# Patient Record
Sex: Female | Born: 1995
Health system: Southern US, Community
[De-identification: ages and names within clinical notes are randomized; demographics above are authoritative.]

## PROBLEM LIST (undated history)

## (undated) DIAGNOSIS — J45909 Unspecified asthma, uncomplicated: Secondary | ICD-10-CM

## (undated) HISTORY — PX: NO PAST SURGERIES: SHX2092

---
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
## 2014-09-03 ENCOUNTER — Emergency Department (HOSPITAL_COMMUNITY)
Admission: EM | Admit: 2014-09-03 | Discharge: 2014-09-04 | Disposition: A | Discharge status: Home / Self Care | Payer: 59 | Attending: Emergency Medicine | Admitting: Emergency Medicine

## 2014-09-03 ENCOUNTER — Encounter (HOSPITAL_COMMUNITY): Payer: Self-pay | Admitting: *Deleted

## 2014-09-03 DIAGNOSIS — R791 Abnormal coagulation profile: Secondary | ICD-10-CM | POA: Diagnosis not present

## 2014-09-03 DIAGNOSIS — J45901 Unspecified asthma with (acute) exacerbation: Secondary | ICD-10-CM | POA: Diagnosis not present

## 2014-09-03 DIAGNOSIS — R079 Chest pain, unspecified: Secondary | ICD-10-CM | POA: Diagnosis present

## 2014-09-03 DIAGNOSIS — Z3202 Encounter for pregnancy test, result negative: Secondary | ICD-10-CM | POA: Insufficient documentation

## 2014-09-03 DIAGNOSIS — R0602 Shortness of breath: Secondary | ICD-10-CM

## 2014-09-03 DIAGNOSIS — R7989 Other specified abnormal findings of blood chemistry: Secondary | ICD-10-CM

## 2014-09-03 HISTORY — DX: Unspecified asthma, uncomplicated: J45.909

## 2014-09-03 MED ORDER — ALBUTEROL SULFATE (2.5 MG/3ML) 0.083% IN NEBU
5.0000 mg | INHALATION_SOLUTION | Freq: Once | RESPIRATORY_TRACT | Status: AC
  Administered 2014-09-03: 5 mg via RESPIRATORY_TRACT
  Filled 2014-09-03: qty 6
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
## 2014-09-03 NOTE — ED Notes (Signed)
The pt is c/o mid-chest pain fior 2 weeks.  Hx of asthma and she is c/o sob.  No visible son at present.  lmp last month

## 2014-09-04 ENCOUNTER — Emergency Department (HOSPITAL_COMMUNITY): Payer: 59

## 2014-09-04 ENCOUNTER — Encounter (HOSPITAL_COMMUNITY): Payer: Self-pay | Admitting: Radiology

## 2014-09-04 LAB — CBC WITH DIFFERENTIAL/PLATELET
Basophils Absolute: 0.1 10*3/uL (ref 0.0–0.1)
Basophils Relative: 1 % (ref 0–1)
Eosinophils Absolute: 0.1 10*3/uL (ref 0.0–0.7)
Eosinophils Relative: 1 % (ref 0–5)
HCT: 37.4 % (ref 36.0–46.0)
Hemoglobin: 12.3 g/dL (ref 12.0–15.0)
Lymphocytes Relative: 40 % (ref 12–46)
Lymphs Abs: 2.1 10*3/uL (ref 0.7–4.0)
MCH: 27.1 pg (ref 26.0–34.0)
MCHC: 32.9 g/dL (ref 30.0–36.0)
MCV: 82.4 fL (ref 78.0–100.0)
Monocytes Absolute: 0.8 10*3/uL (ref 0.1–1.0)
Monocytes Relative: 14 % — ABNORMAL HIGH (ref 3–12)
Neutro Abs: 2.3 10*3/uL (ref 1.7–7.7)
Neutrophils Relative %: 44 % (ref 43–77)
Platelets: 334 10*3/uL (ref 150–400)
RBC: 4.54 MIL/uL (ref 3.87–5.11)
RDW: 13.7 % (ref 11.5–15.5)
WBC: 5.3 10*3/uL (ref 4.0–10.5)

## 2014-09-04 LAB — I-STAT CHEM 8, ED
BUN: 11 mg/dL (ref 6–23)
Calcium, Ion: 1.26 mmol/L — ABNORMAL HIGH (ref 1.12–1.23)
Chloride: 105 mEq/L (ref 96–112)
Creatinine, Ser: 0.8 mg/dL (ref 0.50–1.10)
Glucose, Bld: 93 mg/dL (ref 70–99)
HCT: 42 % (ref 36.0–46.0)
Hemoglobin: 14.3 g/dL (ref 12.0–15.0)
Potassium: 3.5 mEq/L — ABNORMAL LOW (ref 3.7–5.3)
Sodium: 140 mEq/L (ref 137–147)
TCO2: 22 mmol/L (ref 0–100)

## 2014-09-04 LAB — POC URINE PREG, ED: Preg Test, Ur: NEGATIVE

## 2014-09-04 LAB — D-DIMER, QUANTITATIVE: D-Dimer, Quant: 2.69 ug/mL-FEU — ABNORMAL HIGH (ref 0.00–0.48)

## 2014-09-04 MED ORDER — IOHEXOL 350 MG/ML SOLN
100.0000 mL | Freq: Once | INTRAVENOUS | Status: AC | PRN
  Administered 2014-09-04: 100 mL via INTRAVENOUS

## 2014-09-04 NOTE — Discharge Instructions (Signed)

## 2014-09-04 NOTE — ED Notes (Signed)
Patient transported to X-ray 

## 2016-03-06 ENCOUNTER — Encounter (HOSPITAL_COMMUNITY): Payer: Self-pay | Admitting: Emergency Medicine

## 2016-03-06 ENCOUNTER — Emergency Department (HOSPITAL_COMMUNITY)
Admission: EM | Admit: 2016-03-06 | Discharge: 2016-03-06 | Disposition: A | Discharge status: Home / Self Care | Payer: Commercial Managed Care - HMO | Attending: Emergency Medicine | Admitting: Emergency Medicine

## 2016-03-06 ENCOUNTER — Emergency Department (HOSPITAL_COMMUNITY): Payer: Commercial Managed Care - HMO

## 2016-03-06 DIAGNOSIS — J40 Bronchitis, not specified as acute or chronic: Secondary | ICD-10-CM | POA: Insufficient documentation

## 2016-03-06 DIAGNOSIS — Z79899 Other long term (current) drug therapy: Secondary | ICD-10-CM | POA: Insufficient documentation

## 2016-03-06 DIAGNOSIS — R0981 Nasal congestion: Secondary | ICD-10-CM

## 2016-03-06 DIAGNOSIS — Z792 Long term (current) use of antibiotics: Secondary | ICD-10-CM | POA: Insufficient documentation

## 2016-03-06 DIAGNOSIS — R509 Fever, unspecified: Secondary | ICD-10-CM | POA: Diagnosis present

## 2016-03-06 DIAGNOSIS — R599 Enlarged lymph nodes, unspecified: Secondary | ICD-10-CM | POA: Diagnosis not present

## 2016-03-06 DIAGNOSIS — Z7952 Long term (current) use of systemic steroids: Secondary | ICD-10-CM | POA: Insufficient documentation

## 2016-03-06 DIAGNOSIS — J209 Acute bronchitis, unspecified: Secondary | ICD-10-CM

## 2016-03-06 LAB — RAPID STREP SCREEN (MED CTR MEBANE ONLY): Streptococcus, Group A Screen (Direct): NEGATIVE

## 2016-03-06 MED ORDER — PREDNISONE 20 MG PO TABS: ORAL_TABLET | ORAL | Status: DC

## 2016-03-06 MED ORDER — LORATADINE 10 MG PO TABS: 10.0000 mg | ORAL_TABLET | Freq: Every day | ORAL | Status: DC

## 2016-03-06 MED ORDER — ALBUTEROL SULFATE HFA 108 (90 BASE) MCG/ACT IN AERS
2.0000 | INHALATION_SPRAY | Freq: Once | RESPIRATORY_TRACT | Status: AC
  Administered 2016-03-06: 2 via RESPIRATORY_TRACT
  Filled 2016-03-06: qty 6.7

## 2016-03-06 MED ORDER — OXYMETAZOLINE HCL 0.05 % NA SOLN: 1.0000 | Freq: Two times a day (BID) | NASAL | Status: DC

## 2016-03-06 MED ORDER — MOMETASONE FUROATE 50 MCG/ACT NA SUSP: 2.0000 | Freq: Every day | NASAL | Status: DC

## 2016-03-06 MED ORDER — AZITHROMYCIN 250 MG PO TABS: ORAL_TABLET | ORAL | Status: DC

## 2016-03-06 MED ORDER — ALBUTEROL SULFATE (2.5 MG/3ML) 0.083% IN NEBU
5.0000 mg | INHALATION_SOLUTION | Freq: Once | RESPIRATORY_TRACT | Status: AC
  Administered 2016-03-06: 5 mg via RESPIRATORY_TRACT
  Filled 2016-03-06: qty 6

## 2016-03-06 NOTE — ED Provider Notes (Signed)
CSN: [id number]     Arrival date & time 03/06/16  1459 History   First MD Initiated Contact with Patient 03/06/16 1550     Chief Complaint  Patient presents with  . Fever     (Consider location/radiation/quality/duration/timing/severity/associated sxs/prior Treatment) HPI Patient with 5-6 days of fever, intermittent chills, productive cough, and shortness of breath. Was seen by urgent care on Friday diagnosed with sinus infection. Started on Omnicef. Patient states there's been no improvement of her symptoms. She denies any nausea, vomiting or diarrhea. She's had no urinary complaints. No sinus pressure or sore throat. Denies neck pain or stiffness. Past Medical History  Diagnosis Date  . Asthma    History reviewed. No pertinent past surgical history. History reviewed. No pertinent family history. Social History  Substance Use Topics  . Smoking status: Never Smoker   . Smokeless tobacco: None  . Alcohol Use: Yes   OB History    No data available     Review of Systems  Constitutional: Positive for fever, chills and fatigue.  HENT: Negative for congestion, sinus pressure and sore throat.   Respiratory: Positive for cough and shortness of breath. Negative for choking.   Cardiovascular: Negative for chest pain, palpitations and leg swelling.  Gastrointestinal: Negative for nausea, vomiting, abdominal pain and diarrhea.  Genitourinary: Negative for dysuria, frequency and flank pain.  Musculoskeletal: Negative for back pain, neck pain and neck stiffness.  Skin: Negative for rash and wound.  Neurological: Negative for dizziness, weakness, light-headedness, numbness and headaches.  All other systems reviewed and are negative.     Allergies  Review of patient's allergies indicates no known allergies.  Home Medications   Prior to Admission medications   Medication Sig Start Date End Date Taking? Authorizing Provider  etonogestrel (NEXPLANON) 68 MG IMPL implant 1 each by  Subdermal route continuous.   Yes Historical Provider, MD  azithromycin (ZITHROMAX Z-PAK) 250 MG tablet 2 po day one, then 1 daily x 4 days 03/06/16   Loren Racer, MD  loratadine (CLARITIN) 10 MG tablet Take 1 tablet (10 mg total) by mouth daily. 03/06/16   Loren Racer, MD  mometasone (NASONEX) 50 MCG/ACT nasal spray Place 2 sprays into the nose daily. 03/06/16   Loren Racer, MD  oxymetazoline (AFRIN NASAL SPRAY) 0.05 % nasal spray Place 1 spray into both nostrils 2 (two) times daily. 03/06/16   Loren Racer, MD  predniSONE (DELTASONE) 20 MG tablet 3 tabs po day one, then 2 po daily x 4 days 03/06/16   Loren Racer, MD   BP 133/83 mmHg  Pulse 125  Temp(Src) 99 F (37.2 C) (Oral)  Resp 18  SpO2 98%  LMP 02/05/2016 Physical Exam  Constitutional: She is oriented to person, place, and time. She appears well-developed and well-nourished. No distress.  HENT:  Head: Normocephalic and atraumatic.  Mouth/Throat: Oropharynx is clear and moist.  Difficult to visualize oropharynx, minimal nasal mucosal edema. No sinus tenderness to percussion.  Eyes: EOM are normal. Pupils are equal, round, and reactive to light.  Neck: Normal range of motion. Neck supple.  Cardiovascular: Normal rate and regular rhythm.  Exam reveals no gallop and no friction rub.   No murmur heard. Pulmonary/Chest: Effort normal. No respiratory distress. She has no wheezes. She has rales (few scattered rales with diminished breath sounds bilateral bases.). She exhibits no tenderness.  Abdominal: Soft. Bowel sounds are normal. She exhibits no distension and no mass. There is no tenderness. There is no rebound and no guarding.  Musculoskeletal: Normal range of motion. She exhibits no edema or tenderness.  No CVA tenderness bilaterally. No lower extremity swelling or asymmetry. Distal pulses equal and intact.  Lymphadenopathy:    She has cervical adenopathy.  Neurological: She is alert and oriented to person, place, and  time.  Skin: Skin is warm and dry. No rash noted. No erythema.  Psychiatric: She has a normal mood and affect. Her behavior is normal.  Nursing note and vitals reviewed.   ED Course  Procedures (including critical care time) Labs Review Labs Reviewed  RAPID STREP SCREEN (NOT AT [facility])  CULTURE, GROUP A STREP [facility])    Imaging Review Dg Chest 2 View  03/06/2016  CLINICAL DATA:  Cough for 3 weeks.  Asthma. EXAM: CHEST  2 VIEW COMPARISON:  Multiple exams, including 09/04/2014 FINDINGS: The heart size and mediastinal contours are within normal limits. Both lungs are clear. The visualized skeletal structures are unremarkable. IMPRESSION: No active cardiopulmonary disease. Electronically Signed   By: Gaylyn RongWalter  Liebkemann M.D.   On: 03/06/2016 16:32   I have personally reviewed and evaluated these images and lab results as part of my medical decision-making.   EKG Interpretation None      MDM   Final diagnoses:  Bronchitis with bronchospasm  Nasal congestion    Breathing is improved after the treatment. Given albuterol inhaler. We'll get short course of prednisone and Z-Pak.    Loren Raceravid Wayde Gopaul, MD 03/07/16 [phone]

## 2016-03-06 NOTE — Discharge Instructions (Signed)
Upper Respiratory Infection, Adult  Most upper respiratory infections (URIs) are a viral infection of the air passages leading to the lungs. A URI affects the nose, throat, and upper air passages. The most common type of URI is nasopharyngitis and is typically referred to as "the common cold."  URIs run their course and usually go away on their own. Most of the time, a URI does not require medical attention, but sometimes a bacterial infection in the upper airways can follow a viral infection. This is called a secondary infection. Sinus and middle ear infections are common types of secondary upper respiratory infections.  Bacterial pneumonia can also complicate a URI. A URI can worsen asthma and chronic obstructive pulmonary disease (COPD). Sometimes, these complications can require emergency medical care and may be life threatening.   CAUSES  Almost all URIs are caused by viruses. A virus is a type of germ and can spread from one person to another.   RISKS FACTORS  You may be at risk for a URI if:    You smoke.    You have chronic heart or lung disease.   You have a weakened defense (immune) system.    You are very young or very old.    You have nasal allergies or asthma.   You work in crowded or poorly ventilated areas.   You work in health care facilities or schools.  SIGNS AND SYMPTOMS   Symptoms typically develop 2-3 days after you come in contact with a cold virus. Most viral URIs last 7-10 days. However, viral URIs from the influenza virus (flu virus) can last 14-18 days and are typically more severe. Symptoms may include:    Runny or stuffy (congested) nose.    Sneezing.    Cough.    Sore throat.    Headache.    Fatigue.    Fever.    Loss of appetite.    Pain in your forehead, behind your eyes, and over your cheekbones (sinus pain).   Muscle aches.   DIAGNOSIS   Your health care provider may diagnose a URI by:   Physical exam.   Tests to check that your symptoms are not due to  another condition such as:   Strep throat.   Sinusitis.   Pneumonia.   Asthma.  TREATMENT   A URI goes away on its own with time. It cannot be cured with medicines, but medicines may be prescribed or recommended to relieve symptoms. Medicines may help:   Reduce your fever.   Reduce your cough.   Relieve nasal congestion.  HOME CARE INSTRUCTIONS    Take medicines only as directed by your health care provider.    Gargle warm saltwater or take cough drops to comfort your throat as directed by your health care provider.   Use a warm mist humidifier or inhale steam from a shower to increase air moisture. This may make it easier to breathe.   Drink enough fluid to keep your urine clear or pale yellow.    Eat soups and other clear broths and maintain good nutrition.    Rest as needed.    Return to work when your temperature has returned to normal or as your health care provider advises. You may need to stay home longer to avoid infecting others. You can also use a face mask and careful hand washing to prevent spread of the virus.   Increase the usage of your inhaler if you have asthma.    Do not   use any tobacco products, including cigarettes, chewing tobacco, or electronic cigarettes. If you need help quitting, ask your health care provider.  PREVENTION   The best way to protect yourself from getting a cold is to practice good hygiene.    Avoid oral or hand contact with people with cold symptoms.    Wash your hands often if contact occurs.   There is no clear evidence that vitamin C, vitamin E, echinacea, or exercise reduces the chance of developing a cold. However, it is always recommended to get plenty of rest, exercise, and practice good nutrition.   SEEK MEDICAL CARE IF:    You are getting worse rather than better.    Your symptoms are not controlled by medicine.    You have chills.   You have worsening shortness of breath.   You have brown or red mucus.   You have yellow or brown nasal  discharge.   You have pain in your face, especially when you bend forward.   You have a fever.   You have swollen neck glands.   You have pain while swallowing.   You have white areas in the back of your throat.  SEEK IMMEDIATE MEDICAL CARE IF:    You have severe or persistent:    Headache.    Ear pain.    Sinus pain.    Chest pain.   You have chronic lung disease and any of the following:    Wheezing.    Prolonged cough.    Coughing up blood.    A change in your usual mucus.   You have a stiff neck.   You have changes in your:    Vision.    Hearing.    Thinking.    Mood.  MAKE SURE YOU:    Understand these instructions.   Will watch your condition.   Will get help right away if you are not doing well or get worse.     This information is not intended to replace advice given to you by your health care provider. Make sure you discuss any questions you have with your health care provider.     Document Released: 03/21/2001 Document Revised: 02/09/2015 Document Reviewed: 12/31/2013  Elsevier Interactive Patient Education 2016 [employer]    Bronchospasm, Adult  A bronchospasm is a spasm or tightening of the airways going into the lungs. During a bronchospasm breathing becomes more difficult because the airways get smaller. When this happens there can be coughing, a whistling sound when breathing (wheezing), and difficulty breathing. Bronchospasm is often associated with asthma, but not all patients who experience a bronchospasm have asthma.  CAUSES   A bronchospasm is caused by inflammation or irritation of the airways. The inflammation or irritation may be triggered by:    Allergies (such as to animals, pollen, food, or mold). Allergens that cause bronchospasm may cause wheezing immediately after exposure or many hours later.    Infection. Viral infections are believed to be the most common cause of bronchospasm.    Exercise.    Irritants (such as pollution, cigarette smoke, strong odors, aerosol  sprays, and paint fumes).    Weather changes. Winds increase molds and pollens in the air. Rain refreshes the air by washing irritants out. Cold air may cause inflammation.    Stress and emotional upset.   SIGNS AND SYMPTOMS    Wheezing.    Excessive nighttime coughing.    Frequent or severe coughing with a simple cold.      Chest tightness.    Shortness of breath.   DIAGNOSIS   Bronchospasm is usually diagnosed through a history and physical exam. Tests, such as chest X-rays, are sometimes done to look for other conditions.  TREATMENT    Inhaled medicines can be given to open up your airways and help you breathe. The medicines can be given using either an inhaler or a nebulizer machine.   Corticosteroid medicines may be given for severe bronchospasm, usually when it is associated with asthma.  HOME CARE INSTRUCTIONS    Always have a plan prepared for seeking medical care. Know when to call your health care provider and local emergency services (911 in the [place] Know where you can access local emergency care.   Only take medicines as directed by your health care provider.   If you were prescribed an inhaler or nebulizer machine, ask your health care provider to explain how to use it correctly. Always use a spacer with your inhaler if you were given one.   It is necessary to remain calm during an attack. Try to relax and breathe more slowly.   Control your home environment in the following ways:     Change your heating and air conditioning filter at least once a month.     Limit your use of fireplaces and wood stoves.    Do not smoke and do not allow smoking in your home.     Avoid exposure to perfumes and fragrances.     Get rid of pests (such as roaches and mice) and their droppings.     Throw away plants if you see mold on them.     Keep your house clean and dust free.     Replace carpet with wood, tile, or vinyl flooring. Carpet can trap dander and dust.     Use allergy-proof  pillows, mattress covers, and box spring covers.     Wash bed sheets and blankets every week in hot water and dry them in a dryer.     Use blankets that are made of polyester or cotton.     Wash hands frequently.  SEEK MEDICAL CARE IF:    You have muscle aches.    You have chest pain.    The sputum changes from clear or white to yellow, green, gray, or bloody.    The sputum you cough up gets thicker.    There are problems that may be related to the medicine you are given, such as a rash, itching, swelling, or trouble breathing.   SEEK IMMEDIATE MEDICAL CARE IF:    You have worsening wheezing and coughing even after taking your prescribed medicines.    You have increased difficulty breathing.    You develop severe chest pain.  MAKE SURE YOU:    Understand these instructions.   Will watch your condition.   Will get help right away if you are not doing well or get worse.     This information is not intended to replace advice given to you by your health care provider. Make sure you discuss any questions you have with your health care provider.     Document Released: 09/28/2003 Document Revised: 10/16/2014 Document Reviewed: 03/17/2013  Elsevier Interactive Patient Education 2016 [employer]

## 2016-03-06 NOTE — ED Notes (Signed)
Monique Turner at bedside. 

## 2016-03-06 NOTE — ED Notes (Signed)
Per Genevie CheshireKristen HOLD on attempting another IV or collecting blood; DG chest prior.

## 2016-03-06 NOTE — ED Notes (Signed)
2 unsuccessful attempt to start IV/blood draw. Covil at bedside attempting blood draw at present time.

## 2016-03-06 NOTE — ED Notes (Signed)
Pt c/o fever and chills x 3 weeks. Seen at Urgent care 1 week ago, diagnosed with sinus infection but fever has not resolved. First episode diarrhea today.

## 2016-03-09 LAB — CULTURE, GROUP A STREP (THRC)

## 2018-03-15 ENCOUNTER — Emergency Department (HOSPITAL_COMMUNITY): Payer: 59

## 2018-03-15 ENCOUNTER — Emergency Department (HOSPITAL_COMMUNITY)
Admission: EM | Admit: 2018-03-15 | Discharge: 2018-03-15 | Disposition: A | Discharge status: Home / Self Care | Payer: 59 | Attending: Emergency Medicine | Admitting: Emergency Medicine

## 2018-03-15 DIAGNOSIS — M545 Low back pain: Secondary | ICD-10-CM | POA: Diagnosis not present

## 2018-03-15 DIAGNOSIS — R110 Nausea: Secondary | ICD-10-CM | POA: Diagnosis not present

## 2018-03-15 DIAGNOSIS — R51 Headache: Secondary | ICD-10-CM | POA: Insufficient documentation

## 2018-03-15 DIAGNOSIS — J45909 Unspecified asthma, uncomplicated: Secondary | ICD-10-CM | POA: Insufficient documentation

## 2018-03-15 LAB — POC URINE PREG, ED: Preg Test, Ur: NEGATIVE

## 2018-03-15 MED ORDER — ACETAMINOPHEN 500 MG PO TABS
1000.0000 mg | ORAL_TABLET | Freq: Once | ORAL | Status: AC
  Administered 2018-03-15: 1000 mg via ORAL
  Filled 2018-03-15: qty 2

## 2018-03-15 MED ORDER — ONDANSETRON 4 MG PO TBDP
4.0000 mg | ORAL_TABLET | Freq: Once | ORAL | Status: AC
  Administered 2018-03-15: 4 mg via ORAL
  Filled 2018-03-15: qty 1

## 2018-03-15 MED ORDER — ACETAMINOPHEN 500 MG PO TABS: 500.0000 mg | ORAL_TABLET | Freq: Four times a day (QID) | ORAL | 0 refills | Status: DC | PRN

## 2018-03-15 MED ORDER — METHOCARBAMOL 500 MG PO TABS: 500.0000 mg | ORAL_TABLET | Freq: Two times a day (BID) | ORAL | 0 refills | Status: DC

## 2018-03-15 MED ORDER — IBUPROFEN 600 MG PO TABS: 600.0000 mg | ORAL_TABLET | Freq: Four times a day (QID) | ORAL | 0 refills | Status: DC | PRN

## 2018-03-15 MED ORDER — ONDANSETRON HCL 4 MG PO TABS: 4.0000 mg | ORAL_TABLET | Freq: Four times a day (QID) | ORAL | 0 refills | Status: DC
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
## 2018-03-15 NOTE — ED Notes (Signed)
Bed: WTR6 Expected date:  Expected time:  Means of arrival:  Comments: 
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
## 2018-08-05 ENCOUNTER — Telehealth: Payer: Self-pay | Admitting: *Deleted

## 2018-08-27 NOTE — Telephone Encounter (Signed)
A user error has taken place: encounter opened in error, closed for administrative reasons.
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
## 2019-10-27 ENCOUNTER — Encounter (HOSPITAL_COMMUNITY): Payer: Self-pay | Admitting: Emergency Medicine

## 2019-10-27 ENCOUNTER — Other Ambulatory Visit: Payer: Self-pay

## 2019-10-27 DIAGNOSIS — R519 Headache, unspecified: Secondary | ICD-10-CM | POA: Insufficient documentation

## 2019-10-27 DIAGNOSIS — Z5321 Procedure and treatment not carried out due to patient leaving prior to being seen by health care provider: Secondary | ICD-10-CM | POA: Diagnosis not present
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
## 2019-10-28 ENCOUNTER — Emergency Department (HOSPITAL_COMMUNITY)
Admission: EM | Admit: 2019-10-28 | Discharge: 2019-10-28 | Disposition: A | Discharge status: Home / Self Care | Payer: 59 | Attending: Emergency Medicine | Admitting: Emergency Medicine

## 2019-10-28 NOTE — ED Notes (Signed)
Pt was not seen in lobby when called for vitals.

## 2019-10-28 NOTE — ED Notes (Signed)
Pt not seen in lobby when called for vitals to be updated.

## 2020-03-11 ENCOUNTER — Emergency Department (HOSPITAL_BASED_OUTPATIENT_CLINIC_OR_DEPARTMENT_OTHER): Payer: 59

## 2020-03-11 ENCOUNTER — Encounter (HOSPITAL_BASED_OUTPATIENT_CLINIC_OR_DEPARTMENT_OTHER): Payer: Self-pay | Admitting: *Deleted

## 2020-03-11 ENCOUNTER — Emergency Department (HOSPITAL_BASED_OUTPATIENT_CLINIC_OR_DEPARTMENT_OTHER)
Admission: EM | Admit: 2020-03-11 | Discharge: 2020-03-12 | Disposition: A | Discharge status: Home / Self Care | Payer: 59 | Attending: Emergency Medicine | Admitting: Emergency Medicine

## 2020-03-11 ENCOUNTER — Other Ambulatory Visit: Payer: Self-pay

## 2020-03-11 DIAGNOSIS — J45909 Unspecified asthma, uncomplicated: Secondary | ICD-10-CM | POA: Insufficient documentation

## 2020-03-11 DIAGNOSIS — R112 Nausea with vomiting, unspecified: Secondary | ICD-10-CM | POA: Insufficient documentation

## 2020-03-11 DIAGNOSIS — R110 Nausea: Secondary | ICD-10-CM

## 2020-03-11 DIAGNOSIS — R05 Cough: Secondary | ICD-10-CM | POA: Insufficient documentation

## 2020-03-11 DIAGNOSIS — M791 Myalgia, unspecified site: Secondary | ICD-10-CM | POA: Insufficient documentation

## 2020-03-11 DIAGNOSIS — E876 Hypokalemia: Secondary | ICD-10-CM | POA: Diagnosis not present

## 2020-03-11 DIAGNOSIS — Z20822 Contact with and (suspected) exposure to covid-19: Secondary | ICD-10-CM | POA: Diagnosis not present

## 2020-03-11 LAB — COMPREHENSIVE METABOLIC PANEL
ALT: 15 U/L (ref 0–44)
AST: 23 U/L (ref 15–41)
Albumin: 4.1 g/dL (ref 3.5–5.0)
Alkaline Phosphatase: 50 U/L (ref 38–126)
Anion gap: 13 (ref 5–15)
BUN: 6 mg/dL (ref 6–20)
CO2: 23 mmol/L (ref 22–32)
Calcium: 9.1 mg/dL (ref 8.9–10.3)
Chloride: 99 mmol/L (ref 98–111)
Creatinine, Ser: 0.71 mg/dL (ref 0.44–1.00)
GFR calc Af Amer: >60 mL/min (ref >60)
GFR calc non Af Amer: >60 mL/min (ref >60)
Glucose, Bld: 91 mg/dL (ref 70–99)
Potassium: 2.8 mmol/L — ABNORMAL LOW (ref 3.5–5.1)
Sodium: 135 mmol/L (ref 135–145)
Total Bilirubin: 0.6 mg/dL (ref 0.3–1.2)
Total Protein: 8 g/dL (ref 6.5–8.1)

## 2020-03-11 LAB — CBC WITH DIFFERENTIAL/PLATELET
Abs Immature Granulocytes: 0.02 10*3/uL (ref 0.00–0.07)
Basophils Absolute: 0 10*3/uL (ref 0.0–0.1)
Basophils Relative: 0 %
Eosinophils Absolute: 0 10*3/uL (ref 0.0–0.5)
Eosinophils Relative: 0 %
HCT: 33.8 % — ABNORMAL LOW (ref 36.0–46.0)
Hemoglobin: 11.1 g/dL — ABNORMAL LOW (ref 12.0–15.0)
Immature Granulocytes: 0 %
Lymphocytes Relative: 22 %
Lymphs Abs: 1.8 10*3/uL (ref 0.7–4.0)
MCH: 26.4 pg (ref 26.0–34.0)
MCHC: 32.8 g/dL (ref 30.0–36.0)
MCV: 80.3 fL (ref 80.0–100.0)
Monocytes Absolute: 0.8 10*3/uL (ref 0.1–1.0)
Monocytes Relative: 10 %
Neutro Abs: 5.7 10*3/uL (ref 1.7–7.7)
Neutrophils Relative %: 68 %
Platelets: 394 10*3/uL (ref 150–400)
RBC: 4.21 MIL/uL (ref 3.87–5.11)
RDW: 13.6 % (ref 11.5–15.5)
WBC: 8.4 10*3/uL (ref 4.0–10.5)
nRBC: 0 % (ref 0.0–0.2)

## 2020-03-11 LAB — URINALYSIS, MICROSCOPIC (REFLEX)

## 2020-03-11 LAB — LIPASE, BLOOD: Lipase: 20 U/L (ref 11–51)

## 2020-03-11 LAB — URINALYSIS, ROUTINE W REFLEX MICROSCOPIC
Bilirubin Urine: NEGATIVE
Glucose, UA: NEGATIVE mg/dL
Ketones, ur: NEGATIVE mg/dL
Nitrite: NEGATIVE
Protein, ur: NEGATIVE mg/dL
Specific Gravity, Urine: 1.015 (ref 1.005–1.030)
pH: 6 (ref 5.0–8.0)

## 2020-03-11 LAB — SARS CORONAVIRUS 2 BY RT PCR (HOSPITAL ORDER, PERFORMED IN ~~LOC~~ HOSPITAL LAB): SARS Coronavirus 2: NEGATIVE

## 2020-03-11 LAB — PREGNANCY, URINE: Preg Test, Ur: NEGATIVE

## 2020-03-11 MED ORDER — ONDANSETRON HCL 4 MG/2ML IJ SOLN
4.0000 mg | Freq: Once | INTRAMUSCULAR | Status: AC
  Administered 2020-03-11: 4 mg via INTRAVENOUS
  Filled 2020-03-11: qty 2

## 2020-03-11 MED ORDER — ONDANSETRON 4 MG PO TBDP
4.0000 mg | ORAL_TABLET | Freq: Three times a day (TID) | ORAL | 0 refills | Status: DC | PRN
Start: 2020-03-11 — End: 2020-05-13

## 2020-03-11 MED ORDER — POTASSIUM CHLORIDE 10 MEQ/100ML IV SOLN
10.0000 meq | Freq: Once | INTRAVENOUS | Status: AC
  Administered 2020-03-11: 10 meq via INTRAVENOUS
  Filled 2020-03-11: qty 100

## 2020-03-11 MED ORDER — POTASSIUM CHLORIDE CRYS ER 20 MEQ PO TBCR
40.0000 meq | EXTENDED_RELEASE_TABLET | Freq: Once | ORAL | Status: AC
  Administered 2020-03-11: 40 meq via ORAL
  Filled 2020-03-11: qty 2

## 2020-03-11 MED ORDER — ACETAMINOPHEN 325 MG PO TABS
650.0000 mg | ORAL_TABLET | Freq: Once | ORAL | Status: AC
  Administered 2020-03-11: 650 mg via ORAL
  Filled 2020-03-11: qty 2

## 2020-03-11 MED ORDER — POTASSIUM CHLORIDE CRYS ER 20 MEQ PO TBCR
40.0000 meq | EXTENDED_RELEASE_TABLET | Freq: Every day | ORAL | 0 refills | Status: DC
Start: 2020-03-11 — End: 2020-05-13

## 2020-03-11 MED ORDER — SODIUM CHLORIDE 0.9 % IV SOLN: INTRAVENOUS | Status: DC | PRN

## 2020-03-11 MED ORDER — SODIUM CHLORIDE 0.9 % IV BOLUS
1000.0000 mL | Freq: Once | INTRAVENOUS | Status: AC
  Administered 2020-03-11: 1000 mL via INTRAVENOUS

## 2020-03-11 NOTE — Discharge Instructions (Addendum)
You have been seen today for nausea and vomiting. Please read and follow all provided instructions. Return to the emergency room for worsening condition or new concerning symptoms.    Your Covid test was negative.  Your chest x-ray did not show any infection.  1. Medications:  Prescription sent to your pharmacy for potassium supplements and Zofran.  Zofran is for nausea.  Continue usual home medications Take medications as prescribed. Please review all of the medicines and only take them if you do not have an allergy to them.   2. Treatment: rest, drink plenty of fluids  3. Follow Up:  Please follow up with primary care provider in 1 week to have potassium rechecked   It is also a possibility that you have an allergic reaction to any of the medicines that you have been prescribed - Everybody reacts differently to medications and while MOST people have no trouble with most medicines, you may have a reaction such as nausea, vomiting, rash, swelling, shortness of breath. If this is the case, please stop taking the medicine immediately and contact your physician.  ?

## 2020-03-11 NOTE — ED Triage Notes (Signed)
C/o miss period and nausea last normal period April 14

## 2020-03-11 NOTE — ED Provider Notes (Signed)
[facility] EMERGENCY DEPARTMENT Provider Note   CSN: [id number] Arrival date & time: 03/11/20  1658     History Chief Complaint  Patient presents with   Nausea    Monique Turner is a 24 y.o. female with psat medical history significant for asthma presents to emergency department today with chief complaint of intermittent nausea and emesis x 1 week. She admits to21 episode of non bloody and non bilious emesis in the last 24 hours.  She has had significantly decreased p.o. intake secondary to nausea since symptom onset.  She denies any associated abdominal pain.  Patient also states her period was late last month.  She had light vaginal spotting instead of her typical period for the month of May. Her last normal period was 01/21/2020. She is not currently on birth control. She is sexually active with 1 female partner. She is also endorsing generalized body aches and nonproductive cough also x1 week.  She has not taken any medications for her symptoms prior to arrival.  she denies any sick contacts or known Covid exposures.  She denies any fever, chills, hemoptysis, chest pain, abdominal pain, gross hematuria, urinary frequency, dysuria, pelvic pain, vaginal discharge, back pain, rash, loss of sense of taste and smell.  She denies any abdominal surgical history.   Past Medical History:  Diagnosis Date   Asthma     There are no problems to display for this patient.   History reviewed. No pertinent surgical history.   OB History   No obstetric history on file.     No family history on file.  Social History   Tobacco Use   Smoking status: Never Smoker  Substance Use Topics   Alcohol use: Yes   Drug use: Not on file    Home Medications Prior to Admission medications   Medication Sig Start Date End Date Taking? Authorizing Provider  acetaminophen (TYLENOL) 500 MG tablet Take 1 tablet (500 mg total) by mouth every 6 (six) hours as needed. 03/15/18   Law, Waylan Boga,  PA-C  azithromycin (ZITHROMAX Z-PAK) 250 MG tablet 2 po day one, then 1 daily x 4 days 03/06/16   Loren Racer, MD  etonogestrel (NEXPLANON) 68 MG IMPL implant 1 each by Subdermal route continuous.    [provider]  ibuprofen (ADVIL,MOTRIN) 600 MG tablet Take 1 tablet (600 mg total) by mouth every 6 (six) hours as needed. 03/15/18   Law, Waylan Boga, PA-C  loratadine (CLARITIN) 10 MG tablet Take 1 tablet (10 mg total) by mouth daily. 03/06/16   Loren Racer, MD  methocarbamol (ROBAXIN) 500 MG tablet Take 1 tablet (500 mg total) by mouth 2 (two) times daily. 03/15/18   Law, Waylan Boga, PA-C  mometasone (NASONEX) 50 MCG/ACT nasal spray Place 2 sprays into the nose daily. 03/06/16   Loren Racer, MD  ondansetron (ZOFRAN) 4 MG tablet Take 1 tablet (4 mg total) by mouth every 6 (six) hours. 03/15/18   Law, Waylan Boga, PA-C  oxymetazoline (AFRIN NASAL SPRAY) 0.05 % nasal spray Place 1 spray into both nostrils 2 (two) times daily. 03/06/16   Loren Racer, MD  potassium chloride SA (KLOR-CON) 20 MEQ tablet Take 2 tablets (40 mEq total) by mouth daily for 5 days. 03/11/20 03/16/20  Taedyn Glasscock, Caroleen Hamman, PA-C  predniSONE (DELTASONE) 20 MG tablet 3 tabs po day one, then 2 po daily x 4 days 03/06/16   Loren Racer, MD    Allergies    Sulfa antibiotics  Review of  Systems   Review of Systems  All other systems are reviewed and are negative for acute change except as noted in the HPI.   Physical Exam Updated Vital Signs BP (!) 142/82    Pulse 93    Temp 100.1 F (37.8 C) (Oral)    Resp 16    Ht 5\' 1"  (1.549 m)    Wt 81.6 kg    LMP 01/21/2020    SpO2 100%    BMI 34.01 kg/m   Physical Exam Vitals and nursing note reviewed.  Constitutional:      General: She is not in acute distress.    Appearance: She is not ill-appearing.  HENT:     Head: Normocephalic and atraumatic.     Right Ear: Tympanic membrane and external ear normal.     Left Ear: Tympanic membrane and external ear normal.      Nose: Nose normal.     Mouth/Throat:     Mouth: Mucous membranes are moist.     Pharynx: Oropharynx is clear.  Eyes:     General: No scleral icterus.       Right eye: No discharge.        Left eye: No discharge.     Extraocular Movements: Extraocular movements intact.     Conjunctiva/sclera: Conjunctivae normal.     Pupils: Pupils are equal, round, and reactive to light.  Neck:     Vascular: No JVD.  Cardiovascular:     Rate and Rhythm: Normal rate and regular rhythm.     Pulses: Normal pulses.          Radial pulses are 2+ on the right side and 2+ on the left side.     Heart sounds: Normal heart sounds.  Pulmonary:     Comments: Lungs clear to auscultation in all fields. Symmetric chest rise. No wheezing, rales, or rhonchi. Abdominal:     Comments: Abdomen is soft, non-distended, and non-tender in all quadrants. No rigidity, no guarding. No peritoneal signs.  Musculoskeletal:        General: Normal range of motion.     Cervical back: Normal range of motion.  Skin:    General: Skin is warm and dry.     Capillary Refill: Capillary refill takes less than 2 seconds.  Neurological:     Mental Status: She is oriented to person, place, and time.     GCS: GCS eye subscore is 4. GCS verbal subscore is 5. GCS motor subscore is 6.     Comments: Fluent speech, no facial droop.  Psychiatric:        Behavior: Behavior normal.     ED Results / Procedures / Treatments   Labs (all labs ordered are listed, but only abnormal results are displayed) Labs Reviewed  URINALYSIS, ROUTINE W REFLEX MICROSCOPIC - Abnormal; Notable for the following components:      Result Value   Hgb urine dipstick MODERATE (*)    Leukocytes,Ua SMALL (*)    All other components within normal limits  URINALYSIS, MICROSCOPIC (REFLEX) - Abnormal; Notable for the following components:   Bacteria, UA FEW (*)    All other components within normal limits  CBC WITH DIFFERENTIAL/PLATELET - Abnormal; Notable for the  following components:   Hemoglobin 11.1 (*)    HCT 33.8 (*)    All other components within normal limits  COMPREHENSIVE METABOLIC PANEL - Abnormal; Notable for the following components:   Potassium 2.8 (*)    All other components within normal  limits  SARS CORONAVIRUS 2 BY RT PCR [facility] ORDER, PERFORMED IN [facility] LAB)  URINE CULTURE  PREGNANCY, URINE  LIPASE, BLOOD    EKG EKG Interpretation  Date/Time:  Thursday March 11 2020 21:24:36 EDT Ventricular Rate:  88 PR Interval:    QRS Duration: 87 QT Interval:  326 QTC Calculation: 395 R Axis:   62 Text Interpretation: Sinus rhythm Borderline repolarization abnormality since last tracing no significant change Confirmed by Rolan Bucco [phone]) on 03/11/2020 9:32:39 PM   Radiology DG Chest Portable 1 View  Result Date: 03/11/2020 CLINICAL DATA:  Nausea, cough EXAM: PORTABLE CHEST 1 VIEW COMPARISON:  03/06/2016 FINDINGS: The heart size and mediastinal contours are within normal limits. Both lungs are clear. The visualized skeletal structures are unremarkable. IMPRESSION: No active disease. Electronically Signed   By: Sharlet Salina M.D.   On: 03/11/2020 21:20    Procedures Procedures (including critical care time)  Medications Ordered in ED Medications  potassium chloride 10 mEq in 100 mL IVPB (10 mEq Intravenous New Bag/Given 03/11/20 2204)  0.9 %  sodium chloride infusion (has no administration in time range)  acetaminophen (TYLENOL) tablet 650 mg (650 mg Oral Given 03/11/20 2012)  ondansetron (ZOFRAN) injection 4 mg (4 mg Intravenous Given 03/11/20 2121)  sodium chloride 0.9 % bolus 1,000 mL (1,000 mLs Intravenous New Bag/Given 03/11/20 2127)  potassium chloride SA (KLOR-CON) CR tablet 40 mEq (40 mEq Oral Given 03/11/20 2148)    ED Course  I have reviewed the triage vital signs and the nursing notes.  Pertinent labs & imaging results that were available during my care of the patient were reviewed by me and considered  in my medical decision making (see chart for details).    MDM Rules/Calculators/A&P                      History provided by patient with additional history obtained from chart review.     Patient seen and examined. Patient presents awake, alert, hemodynamically stable, non toxic.  Patient noted to be febrile in triage to 100.6.  No tachycardia or hypoxia.  On exam she is well-appearing, no acute distress.  Mucous membranes are moist. Her lungs are clear to auscultation all fields.  She has normal work of breathing.  She has no abdominal tenderness, no peritoneal signs.  No CVA tenderness.   I viewed pt's chest xray and it does not suggest acute infectious processes. Covid test is negative. Labs show no leukocytosis, mild anemia with hemoglobin of 11.1, patient denies history of known anemia.  She also has hypokalemia to 2.8 without any other severe electrolyte derangement.we will replete with IV and p.o.  Labs also show no renal insufficiency, normal anion gap. Lipase is within normal range. EKG shows sinus rhythm. UA shows moderate hemoglobinuria and small leukocytes, she has no urinary symptoms.  Will send for urine culture.  Pregnancy test is negative.  She has had serial negative abdominal exams. She is tolerating PO intake. Unlikely to have acute surgical abdomen. Engaged in shared decision making and patient does not wish to proceed with any imaging of her abdomen.  She is agreeable with plan of care to be discharged home with p.o. potassium replacement.  She has an appointment scheduled with her PCP next week that she will use as follow-up and to have her potassium rechecked.  The patient appears reasonably screened and/or stabilized for discharge and I doubt any other medical condition or other [facility] requiring further  screening, evaluation, or treatment in the ED at this time prior to discharge. The patient is safe for discharge with strict return precautions discussed.  Patient appears reliable  for follow-up.  Portions of this note were generated with [employer]. Dictation errors may occur despite best attempts at proofreading.   Final Clinical Impression(s) / ED Diagnoses Final diagnoses:  Nausea  Hypokalemia    Rx / DC Orders ED Discharge Orders         Ordered    potassium chloride SA (KLOR-CON) 20 MEQ tablet  Daily     03/11/20 2237           Cherre Robins, PA-C 03/11/20 2339    Malvin Johns, MD 03/12/20 1512

## 2020-03-12 MED FILL — POTASSIUM CHLORIDE CRYS ER: 20 | 5 days supply | Qty: 10 | Fill #0

## 2020-03-12 MED FILL — ONDANSETRON ODT 4MG TBDP: 4 | 3 days supply | Qty: 10 | Fill #0

## 2020-03-13 LAB — URINE CULTURE

## 2020-05-13 ENCOUNTER — Other Ambulatory Visit: Payer: Self-pay

## 2020-05-13 ENCOUNTER — Encounter: Payer: Self-pay | Admitting: Family Medicine

## 2020-05-13 ENCOUNTER — Ambulatory Visit: Payer: 59 | Admitting: Family Medicine

## 2020-05-13 VITALS — BP 130/100 | HR 96 | Temp 96.0°F | Resp 18 | Ht 61.0 in | Wt 177.4 lb

## 2020-05-13 DIAGNOSIS — Z0000 Encounter for general adult medical examination without abnormal findings: Secondary | ICD-10-CM | POA: Diagnosis not present

## 2020-05-13 DIAGNOSIS — I10 Essential (primary) hypertension: Secondary | ICD-10-CM

## 2020-05-13 DIAGNOSIS — N920 Excessive and frequent menstruation with regular cycle: Secondary | ICD-10-CM

## 2020-05-13 HISTORY — DX: Excessive and frequent menstruation with regular cycle: N92.0

## 2020-05-13 HISTORY — DX: Essential (primary) hypertension: I10

## 2020-05-13 LAB — THYROID PANEL WITH TSH
Free Thyroxine Index: 2.8 (ref 1.4–3.8)
T3 Uptake: 32 % (ref 22–35)
T4, Total: 8.7 ug/dL (ref 5.1–11.9)
TSH: 0.44 mIU/L

## 2020-05-13 LAB — COMPREHENSIVE METABOLIC PANEL
ALT: 9 U/L (ref 0–35)
AST: 16 U/L (ref 0–37)
Albumin: 4.4 g/dL (ref 3.5–5.2)
Alkaline Phosphatase: 50 U/L (ref 39–117)
BUN: 10 mg/dL (ref 6–23)
CO2: 26 mEq/L (ref 19–32)
Calcium: 9.6 mg/dL (ref 8.4–10.5)
Chloride: 104 mEq/L (ref 96–112)
Creatinine, Ser: 0.67 mg/dL (ref 0.40–1.20)
GFR: 130.12 mL/min (ref >60.00)
Glucose, Bld: 74 mg/dL (ref 70–99)
Potassium: 3.9 mEq/L (ref 3.5–5.1)
Sodium: 138 mEq/L (ref 135–145)
Total Bilirubin: 0.5 mg/dL (ref 0.2–1.2)
Total Protein: 7.2 g/dL (ref 6.0–8.3)

## 2020-05-13 LAB — CBC WITH DIFFERENTIAL/PLATELET
Basophils Absolute: 0.1 10*3/uL (ref 0.0–0.1)
Basophils Relative: 1.5 % (ref 0.0–3.0)
Eosinophils Absolute: 0 10*3/uL (ref 0.0–0.7)
Eosinophils Relative: 0.8 % (ref 0.0–5.0)
HCT: 31.4 % — ABNORMAL LOW (ref 36.0–46.0)
Hemoglobin: 10.3 g/dL — ABNORMAL LOW (ref 12.0–15.0)
Lymphocytes Relative: 28.3 % (ref 12.0–46.0)
Lymphs Abs: 1.8 10*3/uL (ref 0.7–4.0)
MCHC: 32.7 g/dL (ref 30.0–36.0)
MCV: 82.5 fl (ref 78.0–100.0)
Monocytes Absolute: 0.3 10*3/uL (ref 0.1–1.0)
Monocytes Relative: 5.2 % (ref 3.0–12.0)
Neutro Abs: 4.2 10*3/uL (ref 1.4–7.7)
Neutrophils Relative %: 64.2 % (ref 43.0–77.0)
Platelets: 383 10*3/uL (ref 150.0–400.0)
RBC: 3.81 Mil/uL — ABNORMAL LOW (ref 3.87–5.11)
RDW: 15.5 % (ref 11.5–15.5)
WBC: 6.5 10*3/uL (ref 4.0–10.5)

## 2020-05-13 LAB — LIPID PANEL
Cholesterol: 229 mg/dL — ABNORMAL HIGH (ref 0–200)
HDL: 55.4 mg/dL (ref >39.00)
LDL Cholesterol: 162 mg/dL — ABNORMAL HIGH (ref 0–99)
NonHDL: 173.13
Total CHOL/HDL Ratio: 4
Triglycerides: 58 mg/dL (ref 0.0–149.0)
VLDL: 11.6 mg/dL (ref 0.0–40.0)

## 2020-05-13 LAB — HCG, SERUM, QUALITATIVE: Preg, Serum: NEGATIVE

## 2020-05-13 MED ORDER — METOPROLOL SUCCINATE ER 50 MG PO TB24: 50.0000 mg | ORAL_TABLET | Freq: Every day | ORAL | 2 refills | Status: DC

## 2020-05-13 NOTE — Assessment & Plan Note (Signed)
Check labs  Check US pelvis  May need gyn referral

## 2020-05-13 NOTE — Patient Instructions (Signed)
DASH Eating Plan DASH stands for "Dietary Approaches to Stop Hypertension." The DASH eating plan is a healthy eating plan that has been shown to reduce high blood pressure (hypertension). It may also reduce your risk for type 2 diabetes, heart disease, and stroke. The DASH eating plan may also help with weight loss. What are tips for following this plan?  General guidelines  Avoid eating more than 2,300 mg (milligrams) of salt (sodium) a day. If you have hypertension, you may need to reduce your sodium intake to 1,500 mg a day.  Limit alcohol intake to no more than 1 drink a day for nonpregnant women and 2 drinks a day for men. One drink equals 12 oz of beer, 5 oz of wine, or 1 oz of hard liquor.  Work with your health care provider to maintain a healthy body weight or to lose weight. Ask what an ideal weight is for you.  Get at least 30 minutes of exercise that causes your heart to beat faster (aerobic exercise) most days of the week. Activities may include walking, swimming, or biking.  Work with your health care provider or diet and nutrition specialist (dietitian) to adjust your eating plan to your individual calorie needs. Reading food labels   Check food labels for the amount of sodium per serving. Choose foods with less than 5 percent of the Daily Value of sodium. Generally, foods with less than 300 mg of sodium per serving fit into this eating plan.  To find whole grains, look for the word "whole" as the first word in the ingredient list. Shopping  Buy products labeled as "low-sodium" or "no salt added."  Buy fresh foods. Avoid canned foods and premade or frozen meals. Cooking  Avoid adding salt when cooking. Use salt-free seasonings or herbs instead of table salt or sea salt. Check with your health care provider or pharmacist before using salt substitutes.  Do not fry foods. Cook foods using healthy methods such as baking, boiling, grilling, and broiling instead.  Cook with  heart-healthy oils, such as olive, canola, soybean, or sunflower oil. Meal planning  Eat a balanced diet that includes: ? 5 or more servings of fruits and vegetables each day. At each meal, try to fill half of your plate with fruits and vegetables. ? Up to 6-8 servings of whole grains each day. ? Less than 6 oz of lean meat, poultry, or fish each day. A 3-oz serving of meat is about the same size as a deck of cards. One egg equals 1 oz. ? 2 servings of low-fat dairy each day. ? A serving of nuts, seeds, or beans 5 times each week. ? Heart-healthy fats. Healthy fats called Omega-3 fatty acids are found in foods such as flaxseeds and coldwater fish, like sardines, salmon, and mackerel.  Limit how much you eat of the following: ? Canned or prepackaged foods. ? Food that is high in trans fat, such as fried foods. ? Food that is high in saturated fat, such as fatty meat. ? Sweets, desserts, sugary drinks, and other foods with added sugar. ? Full-fat dairy products.  Do not salt foods before eating.  Try to eat at least 2 vegetarian meals each week.  Eat more home-cooked food and less restaurant, buffet, and fast food.  When eating at a restaurant, ask that your food be prepared with less salt or no salt, if possible. What foods are recommended? The items listed may not be a complete list. Talk with your dietitian about   what dietary choices are best for you. Grains Whole-grain or whole-wheat bread. Whole-grain or whole-wheat pasta. Brown rice. Oatmeal. Quinoa. Bulgur. Whole-grain and low-sodium cereals. Pita bread. Low-fat, low-sodium crackers. Whole-wheat flour tortillas. Vegetables Fresh or frozen vegetables (raw, steamed, roasted, or grilled). Low-sodium or reduced-sodium tomato and vegetable juice. Low-sodium or reduced-sodium tomato sauce and tomato paste. Low-sodium or reduced-sodium canned vegetables. Fruits All fresh, dried, or frozen fruit. Canned fruit in natural juice (without  added sugar). Meat and other protein foods Skinless chicken or turkey. Ground chicken or turkey. Pork with fat trimmed off. Fish and seafood. Egg whites. Dried beans, peas, or lentils. Unsalted nuts, nut butters, and seeds. Unsalted canned beans. Lean cuts of beef with fat trimmed off. Low-sodium, lean deli meat. Dairy Low-fat (1%) or fat-free (skim) milk. Fat-free, low-fat, or reduced-fat cheeses. Nonfat, low-sodium ricotta or cottage cheese. Low-fat or nonfat yogurt. Low-fat, low-sodium cheese. Fats and oils Soft margarine without trans fats. Vegetable oil. Low-fat, reduced-fat, or light mayonnaise and salad dressings (reduced-sodium). Canola, safflower, olive, soybean, and sunflower oils. Avocado. Seasoning and other foods Herbs. Spices. Seasoning mixes without salt. Unsalted popcorn and pretzels. Fat-free sweets. What foods are not recommended? The items listed may not be a complete list. Talk with your dietitian about what dietary choices are best for you. Grains Baked goods made with fat, such as croissants, muffins, or some breads. Dry pasta or rice meal packs. Vegetables Creamed or fried vegetables. Vegetables in a cheese sauce. Regular canned vegetables (not low-sodium or reduced-sodium). Regular canned tomato sauce and paste (not low-sodium or reduced-sodium). Regular tomato and vegetable juice (not low-sodium or reduced-sodium). Pickles. Olives. Fruits Canned fruit in a light or heavy syrup. Fried fruit. Fruit in cream or butter sauce. Meat and other protein foods Fatty cuts of meat. Ribs. Fried meat. Bacon. Sausage. Bologna and other processed lunch meats. Salami. Fatback. Hotdogs. Bratwurst. Salted nuts and seeds. Canned beans with added salt. Canned or smoked fish. Whole eggs or egg yolks. Chicken or turkey with skin. Dairy Whole or 2% milk, cream, and half-and-half. Whole or full-fat cream cheese. Whole-fat or sweetened yogurt. Full-fat cheese. Nondairy creamers. Whipped toppings.  Processed cheese and cheese spreads. Fats and oils Butter. Stick margarine. Lard. Shortening. Ghee. Bacon fat. Tropical oils, such as coconut, palm kernel, or palm oil. Seasoning and other foods Salted popcorn and pretzels. Onion salt, garlic salt, seasoned salt, table salt, and sea salt. Worcestershire sauce. Tartar sauce. Barbecue sauce. Teriyaki sauce. Soy sauce, including reduced-sodium. Steak sauce. Canned and packaged gravies. Fish sauce. Oyster sauce. Cocktail sauce. Horseradish that you find on the shelf. Ketchup. Mustard. Meat flavorings and tenderizers. Bouillon cubes. Hot sauce and Tabasco sauce. Premade or packaged marinades. Premade or packaged taco seasonings. Relishes. Regular salad dressings. Where to find more information:  National Heart, Lung, and Blood Institute: www.nhlbi.nih.gov  American Heart Association: www.heart.org Summary  The DASH eating plan is a healthy eating plan that has been shown to reduce high blood pressure (hypertension). It may also reduce your risk for type 2 diabetes, heart disease, and stroke.  With the DASH eating plan, you should limit salt (sodium) intake to 2,300 mg a day. If you have hypertension, you may need to reduce your sodium intake to 1,500 mg a day.  When on the DASH eating plan, aim to eat more fresh fruits and vegetables, whole grains, lean proteins, low-fat dairy, and heart-healthy fats.  Work with your health care provider or diet and nutrition specialist (dietitian) to adjust your eating plan to your   individual calorie needs. This information is not intended to replace advice given to you by your health care provider. Make sure you discuss any questions you have with your health care provider. Document Revised: 09/07/2017 Document Reviewed: 09/18/2016 Elsevier Patient Education  2020 Elsevier Inc.  

## 2020-05-13 NOTE — Assessment & Plan Note (Signed)
Poorly controlled will alter medications, encouraged DASH diet, minimize caffeine and obtain adequate sleep. Report concerning symptoms and follow up as directed and as needed Add toprol 50 mg due to tachycardia and htn Will leave lisinopril for now because was she is going back to her pcp next month

## 2020-05-13 NOTE — Progress Notes (Signed)
Patient ID: LAKINA MCINTIRE, female    DOB: Feb 04, 1996  Age: 24 y.o. MRN: [id number]    Subjective:  Subjective  HPI SHAQUITA FORT presents for trouble with her periods --- they have been lasting longer --- this started in may.  She is also was recently dx with htn and was started on lisinopril 10 mg    She denies, headaches, cp, sob.  She also denies abd pain , nVd,   Review of Systems  Constitutional: Negative for appetite change, diaphoresis, fatigue and unexpected weight change.  Eyes: Negative for pain, redness and visual disturbance.  Respiratory: Negative for cough, chest tightness, shortness of breath and wheezing.   Cardiovascular: Negative for chest pain, palpitations and leg swelling.  Endocrine: Negative for cold intolerance, heat intolerance, polydipsia, polyphagia and polyuria.  Genitourinary: Positive for menstrual problem. Negative for decreased urine volume, difficulty urinating, dysuria, frequency, pelvic pain, urgency, vaginal bleeding and vaginal discharge.  Neurological: Negative for dizziness, light-headedness, numbness and headaches.    History Past Medical History:  Diagnosis Date  . Asthma     She has no past surgical history on file.   Her family history is not on file.She reports that she has never smoked. She does not have any smokeless tobacco history on file. She reports current alcohol use. No history on file for drug use.  Current Outpatient Medications on File Prior to Visit  Medication Sig Dispense Refill  . lisinopril (ZESTRIL) 10 MG tablet Take 10 mg by mouth daily.     No current facility-administered medications on file prior to visit.     Objective:  Objective  Physical Exam Vitals and nursing note reviewed.  Constitutional:      Appearance: She is well-developed.  HENT:     Head: Normocephalic and atraumatic.  Eyes:     Conjunctiva/sclera: Conjunctivae normal.  Neck:     Thyroid: No thyromegaly.     Vascular: No carotid bruit or  JVD.  Cardiovascular:     Rate and Rhythm: Normal rate and regular rhythm.     Heart sounds: Normal heart sounds. No murmur heard.   Pulmonary:     Effort: Pulmonary effort is normal. No respiratory distress.     Breath sounds: Normal breath sounds. No wheezing or rales.  Chest:     Chest wall: No tenderness.  Musculoskeletal:     Cervical back: Normal range of motion and neck supple.  Neurological:     Mental Status: She is alert and oriented to person, place, and time.    BP (!) 130/100 (BP Location: Left Arm, Patient Position: Sitting, Cuff Size: Normal)   Pulse 96   Temp (!) 96 F (35.6 C)   Resp 18   Ht 5\' 1"  (1.549 m)   Wt 177 lb 6.4 oz (80.5 kg)   LMP 05/05/2020   SpO2 100%   BMI 33.52 kg/m  Wt Readings from Last 3 Encounters:  05/13/20 177 lb 6.4 oz (80.5 kg)  03/11/20 180 lb (81.6 kg)  03/15/18 165 lb (74.8 kg)     Lab Results  Component Value Date   WBC 8.4 03/11/2020   HGB 11.1 (L) 03/11/2020   HCT 33.8 (L) 03/11/2020   PLT 394 03/11/2020   GLUCOSE 91 03/11/2020   ALT 15 03/11/2020   AST 23 03/11/2020   NA 135 03/11/2020   K 2.8 (L) 03/11/2020   CL 99 03/11/2020   CREATININE 0.71 03/11/2020   BUN 6 03/11/2020   CO2 23 03/11/2020  DG Chest Portable 1 View  Result Date: 03/11/2020 CLINICAL DATA:  Nausea, cough EXAM: PORTABLE CHEST 1 VIEW COMPARISON:  03/06/2016 FINDINGS: The heart size and mediastinal contours are within normal limits. Both lungs are clear. The visualized skeletal structures are unremarkable. IMPRESSION: No active disease. Electronically Signed   By: Sharlet Salina M.D.   On: 03/11/2020 21:20     Assessment & Plan:  Plan  I have discontinued Ripley Fraise etonogestrel, azithromycin, loratadine, oxymetazoline, mometasone, predniSONE, ibuprofen, acetaminophen, methocarbamol, ondansetron, potassium chloride SA, and ondansetron. I am also having her start on metoprolol succinate. Additionally, I am having her maintain her  lisinopril.  Meds ordered this encounter  Medications  . metoprolol succinate (TOPROL-XL) 50 MG 24 hr tablet    Sig: Take 1 tablet (50 mg total) by mouth daily. Take with or immediately following a meal.    Dispense:  30 tablet    Refill:  2    Problem List Items Addressed This Visit      Unprioritized   Essential hypertension - Primary    Poorly controlled will alter medications, encouraged DASH diet, minimize caffeine and obtain adequate sleep. Report concerning symptoms and follow up as directed and as needed Add toprol 50 mg due to tachycardia and htn Will leave lisinopril for now because was she is going back to her pcp next month      Relevant Medications   lisinopril (ZESTRIL) 10 MG tablet   metoprolol succinate (TOPROL-XL) 50 MG 24 hr tablet   Other Relevant Orders   Thyroid Panel With TSH   Comprehensive metabolic panel   Menorrhagia with regular cycle    Check labs  Check US pelvis  May need gyn referral       Relevant Orders   CBC with Differential/Platelet   Thyroid Panel With TSH   US Pelvic Complete With Transvaginal   hCG, serum, qualitative   Preventative health care    She will see pcp for cpe next month Labs drawn today       Relevant Orders   Lipid panel      Follow-up: Return if symptoms worsen or fail to improve.  Donato Schultz, DO

## 2020-05-13 NOTE — Assessment & Plan Note (Signed)
She will see pcp for cpe next month Labs drawn today

## 2020-05-14 MED FILL — AZITHROMYCIN 500 MG TABS: 500 | 1 days supply | Qty: 2 | Fill #0

## 2020-05-17 ENCOUNTER — Ambulatory Visit: Payer: 59 | Admitting: Family Medicine

## 2020-05-19 ENCOUNTER — Other Ambulatory Visit: Payer: Self-pay

## 2020-05-19 ENCOUNTER — Ambulatory Visit (HOSPITAL_BASED_OUTPATIENT_CLINIC_OR_DEPARTMENT_OTHER)
Admission: RE | Admit: 2020-05-19 | Discharge: 2020-05-19 | Disposition: A | Discharge status: Home / Self Care | Payer: 59 | Source: Ambulatory Visit | Attending: Family Medicine | Admitting: Family Medicine

## 2020-05-19 DIAGNOSIS — I10 Essential (primary) hypertension: Secondary | ICD-10-CM

## 2020-05-19 DIAGNOSIS — N920 Excessive and frequent menstruation with regular cycle: Secondary | ICD-10-CM | POA: Diagnosis present

## 2020-05-19 MED ORDER — METOPROLOL SUCCINATE ER 50 MG PO TB24: 50.0000 mg | ORAL_TABLET | Freq: Every day | ORAL | 2 refills | Status: DC

## 2020-05-19 MED FILL — METOPROLOL SUCCINATE ER 50: 50 | 30 days supply | Qty: 30 | Fill #0

## 2020-05-20 ENCOUNTER — Other Ambulatory Visit: Payer: Self-pay

## 2020-05-20 NOTE — Progress Notes (Signed)
Was placing referral to gyn with Dr. Katrinka Blazing. However she was able to get in to another gyn quicker next week. Encounter in error.

## 2020-07-19 ENCOUNTER — Other Ambulatory Visit: Payer: Self-pay | Admitting: Family Medicine

## 2020-07-19 ENCOUNTER — Other Ambulatory Visit: Payer: Self-pay

## 2020-07-19 DIAGNOSIS — I10 Essential (primary) hypertension: Secondary | ICD-10-CM

## 2020-07-19 MED ORDER — METOPROLOL SUCCINATE ER 50 MG PO TB24: 50.0000 mg | ORAL_TABLET | Freq: Every day | ORAL | 5 refills | Status: DC

## 2020-07-19 NOTE — Telephone Encounter (Signed)
Patient called requesting medication to be refilled. Refill sent to pharmacy at [facility] for patient.

## 2020-07-20 MED FILL — METOPROLOL SUCCINATE ER 50: 50 | 30 days supply | Qty: 30 | Fill #0

## 2020-08-16 ENCOUNTER — Ambulatory Visit: Payer: 59 | Admitting: Family Medicine

## 2020-08-16 ENCOUNTER — Other Ambulatory Visit: Payer: Self-pay

## 2020-08-16 ENCOUNTER — Other Ambulatory Visit: Payer: Self-pay | Admitting: Family Medicine

## 2020-08-16 ENCOUNTER — Encounter: Payer: Self-pay | Admitting: Family Medicine

## 2020-08-16 VITALS — BP 182/120 | HR 76 | Temp 98.4°F | Ht 61.0 in | Wt 174.0 lb

## 2020-08-16 DIAGNOSIS — I10 Essential (primary) hypertension: Secondary | ICD-10-CM | POA: Diagnosis not present

## 2020-08-16 MED ORDER — METOPROLOL SUCCINATE ER 50 MG PO TB24: 50.0000 mg | ORAL_TABLET | Freq: Every day | ORAL | 5 refills | Status: DC

## 2020-08-16 MED ORDER — AMLODIPINE BESYLATE 5 MG PO TABS: 5.0000 mg | ORAL_TABLET | Freq: Every day | ORAL | 3 refills | Status: DC

## 2020-08-16 MED FILL — METOPROLOL SUCCINATE ER 50: 50 | 30 days supply | Qty: 30 | Fill #0

## 2020-08-16 MED FILL — AMLODIPINE BESYLATE 5 MG TA: 5 | 30 days supply | Qty: 30 | Fill #0

## 2020-08-16 NOTE — Patient Instructions (Addendum)
Keep the diet clean and stay active.  Monitor your blood pressure over the next week and check in with me regarding the values.   Don't get pregnant on this medication.   Healthy Eating Plan Many factors influence your heart health, including eating and exercise habits. Heart (coronary) risk increases with abnormal blood fat (lipid) levels. Heart-healthy meal planning includes limiting unhealthy fats, increasing healthy fats, and making other small dietary changes. This includes maintaining a healthy body weight to help keep lipid levels within a normal range.  WHAT IS MY PLAN?  Your health care provider recommends that you:  Drink a glass of water before meals to help with satiety.  Eat slowly.  An alternative to the water is to add Metamucil. This will help with satiety as well. It does contain calories, unlike water.  WHAT TYPES OF FAT SHOULD I CHOOSE?  Choose healthy fats more often. Choose monounsaturated and polyunsaturated fats, such as olive oil and canola oil, flaxseeds, walnuts, almonds, and seeds.  Eat more omega-3 fats. Good choices include salmon, mackerel, sardines, tuna, flaxseed oil, and ground flaxseeds. Aim to eat fish at least two times each week.  Avoid foods with partially hydrogenated oils in them. These contain trans fats. Examples of foods that contain trans fats are stick margarine, some tub margarines, cookies, crackers, and other baked goods. If you are going to avoid a fat, this is the one to avoid!  WHAT GENERAL GUIDELINES DO I NEED TO FOLLOW?  Check food labels carefully to identify foods with trans fats. Avoid these types of options when possible.  Fill one half of your plate with vegetables and green salads. Eat 4-5 servings of vegetables per day. A serving of vegetables equals 1 cup of raw leafy vegetables,  cup of raw or cooked cut-up vegetables, or  cup of vegetable juice.  Fill one fourth of your plate with whole grains. Look for the word "whole"  as the first word in the ingredient list.  Fill one fourth of your plate with lean protein foods.  Eat 4-5 servings of fruit per day. A serving of fruit equals one medium whole fruit,  cup of dried fruit,  cup of fresh, frozen, or canned fruit. Try to avoid fruits in cups/syrups as the sugar content can be high.  Eat more foods that contain soluble fiber. Examples of foods that contain this type of fiber are apples, broccoli, carrots, beans, peas, and barley. Aim to get 20-30 g of fiber per day.  Eat more home-cooked food and less restaurant, buffet, and fast food.  Limit or avoid alcohol.  Limit foods that are high in starch and sugar.  Avoid fried foods when able.  Cook foods by using methods other than frying. Baking, boiling, grilling, and broiling are all great options. Other fat-reducing suggestions include: ? Removing the skin from poultry. ? Removing all visible fats from meats. ? Skimming the fat off of stews, soups, and gravies before serving them. ? Steaming vegetables in water or broth.  Lose weight if you are overweight. Losing just 5-10% of your initial body weight can help your overall health and prevent diseases such as diabetes and heart disease.  Increase your consumption of nuts, legumes, and seeds to 4-5 servings per week. One serving of dried beans or legumes equals  cup after being cooked, one serving of nuts equals 1 ounces, and one serving of seeds equals  ounce or 1 tablespoon.  WHAT ARE GOOD FOODS CAN I EAT? Grains Grainy  breads (try to find bread that is 3 g of fiber per slice or greater), oatmeal, light popcorn. Whole-grain cereals. Rice and pasta, including brown rice and those that are made with whole wheat. Edamame pasta is a great alternative to grain pasta. It has a higher protein content. Try to avoid significant consumption of white bread, sugary cereals, or pastries/baked goods.  Vegetables All vegetables. Cooked white potatoes do not count as  vegetables.  Fruits All fruits, but limit pineapple and bananas as these fruits have a higher sugar content.  Meats and Other Protein Sources Lean, well-trimmed beef, veal, pork, and lamb. Chicken and [place] without skin. All fish and shellfish. Wild duck, rabbit, pheasant, and venison. Egg whites or low-cholesterol egg substitutes. Dried beans, peas, lentils, and tofu.Seeds and most nuts.  Dairy Low-fat or nonfat cheeses, including ricotta, string, and mozzarella. Skim or 1% milk that is liquid, powdered, or evaporated. Buttermilk that is made with low-fat milk. Nonfat or low-fat yogurt. Soy/Almond milk are good alternatives if you cannot handle dairy.  Beverages Water is the best for you. Sports drinks with less sugar are more desirable unless you are a highly active athlete.  Sweets and Desserts Sherbets and fruit ices. Honey, jam, marmalade, jelly, and syrups. Dark chocolate.  Eat all sweets and desserts in moderation.  Fats and Oils Nonhydrogenated (trans-free) margarines. Vegetable oils, including soybean, sesame, sunflower, olive, peanut, safflower, corn, canola, and cottonseed. Salad dressings or mayonnaise that are made with a vegetable oil. Limit added fats and oils that you use for cooking, baking, salads, and as spreads.  Other Cocoa powder. Coffee and tea. Most condiments.  The items listed above may not be a complete list of recommended foods or beverages. Contact your dietitian for more options.

## 2020-08-16 NOTE — Progress Notes (Signed)
Chief Complaint  Patient presents with  . Hypertension  . Headache  . Dizziness    Subjective Monique Turner is a 24 y.o. female who presents for hypertension follow up. She does monitor home blood pressures. Blood pressures ranging from 140-150's/100's on average. She is compliant with medication- Toprol XL 50 mg/d Patient has these side effects of medication: none She is sometimes adhering to a healthy diet overall. Current exercise: none Father has high BP.  Denies snoring, unexplained wt changes, sweating, pallor, racing heart.    Past Medical History:  Diagnosis Date  . Asthma     Exam BP (!) 182/120 (BP Location: Left Arm, Patient Position: Sitting, Cuff Size: Normal)   Pulse 76   Temp 98.4 F (36.9 C) (Oral)   Ht 5\' 1"  (1.549 m)   Wt 174 lb (78.9 kg)   SpO2 100%   BMI 32.88 kg/m  General:  well developed, well nourished, in no apparent distress Heart: RRR, no bruits, no LE edema Lungs: clear to auscultation, no accessory muscle use Psych: well oriented with normal range of affect and appropriate judgment/insight  Essential hypertension - Plan: amLODipine (NORVASC) 5 MG tablet, metoprolol succinate (TOPROL-XL) 50 MG 24 hr tablet  Status: Chronic, uncontrolled. Counseled on diet and exercise. Add Norvasc 5 mg/d, cont Toprol XL 50 mg/d.  F/u in 1 week w readings. If refractory, would consider 2ndary HTN workup w Renin, aldo, TSH, urine metanephrines, retroper .  The patient voiced understanding and agreement to the plan.  [place] [name], DO 08/16/20  2:51 PM

## 2020-09-07 ENCOUNTER — Other Ambulatory Visit: Payer: Self-pay | Admitting: Family Medicine

## 2020-09-07 DIAGNOSIS — I10 Essential (primary) hypertension: Secondary | ICD-10-CM

## 2020-09-07 MED ORDER — AMLODIPINE BESYLATE 5 MG PO TABS: 5.0000 mg | ORAL_TABLET | Freq: Every day | ORAL | 3 refills | Status: DC

## 2020-09-07 MED ORDER — METOPROLOL SUCCINATE ER 50 MG PO TB24: 50.0000 mg | ORAL_TABLET | Freq: Every day | ORAL | 5 refills | Status: DC

## 2020-09-08 MED FILL — METOPROLOL SUCCINATE ER 50: 50 | 30 days supply | Qty: 30 | Fill #0

## 2020-09-08 MED FILL — AMLODIPINE BESYLATE 5 MG TA: 5 | 30 days supply | Qty: 30 | Fill #0

## 2020-09-24 ENCOUNTER — Other Ambulatory Visit (HOSPITAL_BASED_OUTPATIENT_CLINIC_OR_DEPARTMENT_OTHER): Payer: Self-pay | Admitting: Obstetrics & Gynecology

## 2020-09-24 MED FILL — MEDROXYPROGESTERONE 5 MG TA: 5 | 21 days supply | Qty: 7 | Fill #0

## 2020-10-11 ENCOUNTER — Other Ambulatory Visit: Payer: Self-pay | Admitting: *Deleted

## 2020-10-11 DIAGNOSIS — I10 Essential (primary) hypertension: Secondary | ICD-10-CM

## 2020-10-11 MED ORDER — AMLODIPINE BESYLATE 5 MG PO TABS: 5.0000 mg | ORAL_TABLET | Freq: Every day | ORAL | 1 refills | Status: DC

## 2020-10-11 MED ORDER — METOPROLOL SUCCINATE ER 50 MG PO TB24: 50.0000 mg | ORAL_TABLET | Freq: Every day | ORAL | 1 refills | Status: DC

## 2020-10-14 ENCOUNTER — Telehealth: Payer: Self-pay

## 2020-10-14 DIAGNOSIS — I10 Essential (primary) hypertension: Secondary | ICD-10-CM

## 2020-10-14 MED ORDER — METOPROLOL SUCCINATE ER 50 MG PO TB24: 50.0000 mg | ORAL_TABLET | Freq: Every day | ORAL | 1 refills | Status: DC

## 2020-10-14 MED ORDER — AMLODIPINE BESYLATE 5 MG PO TABS: 5.0000 mg | ORAL_TABLET | Freq: Every day | ORAL | 1 refills | Status: DC

## 2020-10-14 NOTE — Telephone Encounter (Signed)
Patient requesting BP prescriptions change from [facility] pharmacy to [facility] on [address].

## 2020-10-21 MED FILL — MEDROXYPROGESTERONE 5 MG TA: 5 | 21 days supply | Qty: 7 | Fill #1

## 2020-11-08 ENCOUNTER — Other Ambulatory Visit: Payer: Self-pay | Admitting: Family Medicine

## 2020-11-08 ENCOUNTER — Other Ambulatory Visit: Payer: Self-pay

## 2020-11-08 MED ORDER — IBUPROFEN 600 MG PO TABS: 600.0000 mg | ORAL_TABLET | Freq: Three times a day (TID) | ORAL | 0 refills | Status: DC | PRN

## 2020-11-08 MED FILL — IBUPROFEN 600 MG TABLET: 600 | 10 days supply | Qty: 30 | Fill #0

## 2020-11-12 ENCOUNTER — Other Ambulatory Visit: Payer: Self-pay | Admitting: Family Medicine

## 2020-11-12 DIAGNOSIS — I10 Essential (primary) hypertension: Secondary | ICD-10-CM

## 2020-11-12 MED ORDER — METOPROLOL SUCCINATE ER 50 MG PO TB24: 50.0000 mg | ORAL_TABLET | Freq: Every day | ORAL | 1 refills | Status: DC

## 2020-11-12 MED ORDER — AMLODIPINE BESYLATE 5 MG PO TABS: 5.0000 mg | ORAL_TABLET | Freq: Every day | ORAL | 1 refills | Status: DC

## 2020-11-12 MED FILL — AMLODIPINE BESYLATE 5 MG TA: 5 | 30 days supply | Qty: 30 | Fill #0

## 2020-11-12 MED FILL — METOPROLOL SUCCINATE ER 50: 50 | 30 days supply | Qty: 30 | Fill #0

## 2020-11-24 MED FILL — MEDROXYPROGESTERONE 5 MG TA: 5 | 21 days supply | Qty: 7 | Fill #2

## 2020-12-13 ENCOUNTER — Other Ambulatory Visit: Payer: Self-pay | Admitting: Family Medicine

## 2020-12-13 ENCOUNTER — Other Ambulatory Visit: Payer: Self-pay

## 2020-12-13 DIAGNOSIS — I10 Essential (primary) hypertension: Secondary | ICD-10-CM

## 2020-12-13 MED ORDER — AMLODIPINE BESYLATE 5 MG PO TABS: 5.0000 mg | ORAL_TABLET | Freq: Every day | ORAL | 1 refills | Status: DC

## 2020-12-13 MED FILL — AMLODIPINE BESYLATE 5 MG TA: 5 | 30 days supply | Qty: 30 | Fill #0

## 2020-12-16 MED FILL — METOPROLOL SUCCINATE ER 50: 50 | 30 days supply | Qty: 30 | Fill #1

## 2020-12-30 ENCOUNTER — Other Ambulatory Visit (HOSPITAL_BASED_OUTPATIENT_CLINIC_OR_DEPARTMENT_OTHER): Payer: Self-pay | Admitting: Obstetrics & Gynecology

## 2020-12-30 LAB — RESULTS CONSOLE HPV: CHL HPV: NEGATIVE

## 2020-12-30 LAB — HM PAP SMEAR: HM Pap smear: ABNORMAL

## 2020-12-30 MED FILL — MEDROXYPROGESTERONE 5 MG TA: 5 | 28 days supply | Qty: 7 | Fill #0

## 2021-01-04 ENCOUNTER — Other Ambulatory Visit (HOSPITAL_BASED_OUTPATIENT_CLINIC_OR_DEPARTMENT_OTHER): Payer: Self-pay | Admitting: Obstetrics & Gynecology

## 2021-01-04 MED FILL — METRONIDAZOLE 500 MG TABS: 500 | 1 days supply | Qty: 4 | Fill #0

## 2021-01-12 ENCOUNTER — Other Ambulatory Visit (HOSPITAL_BASED_OUTPATIENT_CLINIC_OR_DEPARTMENT_OTHER): Payer: Self-pay

## 2021-01-12 ENCOUNTER — Other Ambulatory Visit: Payer: Self-pay

## 2021-01-12 DIAGNOSIS — I10 Essential (primary) hypertension: Secondary | ICD-10-CM

## 2021-01-12 MED ORDER — AMLODIPINE BESYLATE 5 MG PO TABS
5.0000 mg | ORAL_TABLET | Freq: Every day | ORAL | 3 refills | Status: DC
  Filled 2021-01-12: qty 30, 30d supply, fill #0

## 2021-01-12 MED ORDER — AMLODIPINE BESYLATE 5 MG PO TABS: ORAL_TABLET | Freq: Every day | ORAL | 3 refills | Status: DC

## 2021-01-14 ENCOUNTER — Other Ambulatory Visit: Payer: Self-pay

## 2021-01-14 ENCOUNTER — Other Ambulatory Visit (HOSPITAL_BASED_OUTPATIENT_CLINIC_OR_DEPARTMENT_OTHER): Payer: Self-pay

## 2021-01-14 DIAGNOSIS — I10 Essential (primary) hypertension: Secondary | ICD-10-CM

## 2021-01-14 MED ORDER — METOPROLOL SUCCINATE ER 50 MG PO TB24
ORAL_TABLET | ORAL | 1 refills | Status: DC
  Filled 2021-01-14: qty 30, 30d supply, fill #0

## 2021-02-09 ENCOUNTER — Other Ambulatory Visit: Payer: Self-pay | Admitting: Family Medicine

## 2021-02-09 ENCOUNTER — Other Ambulatory Visit (HOSPITAL_BASED_OUTPATIENT_CLINIC_OR_DEPARTMENT_OTHER): Payer: Self-pay

## 2021-02-09 DIAGNOSIS — I10 Essential (primary) hypertension: Secondary | ICD-10-CM

## 2021-02-09 MED ORDER — METOPROLOL SUCCINATE ER 50 MG PO TB24
ORAL_TABLET | ORAL | 1 refills | Status: DC
  Filled 2021-02-09: qty 30, 30d supply, fill #0
  Filled 2021-03-16: qty 30, 30d supply, fill #1

## 2021-02-09 MED ORDER — AMLODIPINE BESYLATE 5 MG PO TABS
5.0000 mg | ORAL_TABLET | Freq: Every day | ORAL | 3 refills | Status: DC
  Filled 2021-02-09: qty 30, 30d supply, fill #0

## 2021-03-02 ENCOUNTER — Encounter (HOSPITAL_BASED_OUTPATIENT_CLINIC_OR_DEPARTMENT_OTHER): Payer: Self-pay

## 2021-03-02 ENCOUNTER — Other Ambulatory Visit (HOSPITAL_BASED_OUTPATIENT_CLINIC_OR_DEPARTMENT_OTHER): Payer: Self-pay

## 2021-03-02 ENCOUNTER — Other Ambulatory Visit: Payer: Self-pay

## 2021-03-02 ENCOUNTER — Emergency Department (HOSPITAL_BASED_OUTPATIENT_CLINIC_OR_DEPARTMENT_OTHER)
Admission: EM | Admit: 2021-03-02 | Discharge: 2021-03-02 | Disposition: A | Discharge status: Home / Self Care | Payer: 59 | Attending: Emergency Medicine | Admitting: Emergency Medicine

## 2021-03-02 DIAGNOSIS — Z79899 Other long term (current) drug therapy: Secondary | ICD-10-CM | POA: Diagnosis not present

## 2021-03-02 DIAGNOSIS — I10 Essential (primary) hypertension: Secondary | ICD-10-CM | POA: Diagnosis not present

## 2021-03-02 DIAGNOSIS — Z20822 Contact with and (suspected) exposure to covid-19: Secondary | ICD-10-CM | POA: Diagnosis not present

## 2021-03-02 DIAGNOSIS — R5383 Other fatigue: Secondary | ICD-10-CM | POA: Diagnosis not present

## 2021-03-02 DIAGNOSIS — R61 Generalized hyperhidrosis: Secondary | ICD-10-CM | POA: Diagnosis not present

## 2021-03-02 DIAGNOSIS — J45909 Unspecified asthma, uncomplicated: Secondary | ICD-10-CM | POA: Insufficient documentation

## 2021-03-02 DIAGNOSIS — R509 Fever, unspecified: Secondary | ICD-10-CM

## 2021-03-02 DIAGNOSIS — R630 Anorexia: Secondary | ICD-10-CM | POA: Insufficient documentation

## 2021-03-02 LAB — URINALYSIS, MICROSCOPIC (REFLEX)

## 2021-03-02 LAB — RESP PANEL BY RT-PCR (FLU A&B, COVID) ARPGX2
Influenza A by PCR: NEGATIVE
Influenza B by PCR: NEGATIVE
SARS Coronavirus 2 by RT PCR: NEGATIVE

## 2021-03-02 LAB — URINALYSIS, ROUTINE W REFLEX MICROSCOPIC
Bilirubin Urine: NEGATIVE
Glucose, UA: NEGATIVE mg/dL
Ketones, ur: 40 mg/dL — AB
Nitrite: NEGATIVE
Protein, ur: NEGATIVE mg/dL
Specific Gravity, Urine: 1.015 (ref 1.005–1.030)
pH: 6 (ref 5.0–8.0)

## 2021-03-02 LAB — PREGNANCY, URINE: Preg Test, Ur: NEGATIVE

## 2021-03-02 MED ORDER — ACETAMINOPHEN ER 650 MG PO TBCR
650.0000 mg | EXTENDED_RELEASE_TABLET | Freq: Three times a day (TID) | ORAL | 0 refills | Status: DC
  Filled 2021-03-02: qty 30, 10d supply, fill #0

## 2021-03-02 MED ORDER — ACETAMINOPHEN 325 MG PO TABS
650.0000 mg | ORAL_TABLET | Freq: Once | ORAL | Status: AC
  Administered 2021-03-02: 650 mg via ORAL
  Filled 2021-03-02: qty 2

## 2021-03-02 NOTE — ED Triage Notes (Addendum)
Pt c/o fatigue, night sweats, decreased appetite x 9 days-pt states she had neg covid, flu and mono since sx started-NAD-steady gait

## 2021-03-02 NOTE — ED Provider Notes (Signed)
[facility] EMERGENCY DEPARTMENT Provider Note   CSN: [id number] Arrival date & time: 03/02/21  1854     History Chief Complaint  Patient presents with  . Fatigue    Monique Turner is a 25 y.o. female.  HPI    25 year old female comes in with chief complaint of fatigue. Patient has history of asthma.  She reports that over the last week she has had increased fatigue, night sweats and reduced appetite.  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  She reports no chest pain, shortness of breath, headache, neck pain, cough, UTI-like symptoms, abdominal pain, rash.  No sick contacts.  No recent travels.  She has received COVID-19 vaccine.  Past Medical History:  Diagnosis Date  . Asthma     Patient Active Problem List   Diagnosis Date Noted  . Essential hypertension 05/13/2020  . Menorrhagia with regular cycle 05/13/2020  . HTN (hypertension) 05/13/2020  . Preventative health care 05/13/2020    History reviewed. No pertinent surgical history.   OB History   No obstetric history on file.     No family history on file.  Social History   Tobacco Use  . Smoking status: Never Smoker  . Smokeless tobacco: Never Used  Substance Use Topics  . Alcohol use: Yes    Comment: occ  . Drug use: Never    Home Medications Prior to Admission medications   Medication Sig Start Date End Date Taking? Authorizing Provider  acetaminophen (TYLENOL 8 HOUR) 650 MG CR tablet Take 1 tablet (650 mg total) by mouth every 8 (eight) hours. 03/02/21  Yes Montrel Donahoe, MD  amLODipine (NORVASC) 5 MG tablet TAKE 1 TABLET (5 MG TOTAL) BY MOUTH DAILY. 01/12/21 01/12/22  Sharlene Dory, DO  amLODipine (NORVASC) 5 MG tablet Take 1 tablet (5 mg total) by mouth daily. 02/09/21   Sharlene Dory, DO  ibuprofen (ADVIL) 600 MG tablet TAKE 1 TABLET BY MOUTH EVERY 8 HOURS AS NEEDED 11/08/20 11/08/21  Wendling,  Jilda Roche, DO  medroxyPROGESTERone (PROVERA) 5 MG tablet TAKE 1 TABLET BY MOUTH EVERY DAY FOR 7 DAYS. (7 DAYS EACH MONTH FROM THE 20TH OF EACH MONTH) 12/30/20 12/30/21  Shea Evans, MD  medroxyPROGESTERone (PROVERA) 5 MG tablet TAKE 1 TABLET BY MOUTH EVERY DAY FOR 7 DAYS. (7 DAYS EACH MONTH FROM THE 20TH OF EACH MONTH) 09/24/20 09/24/21  Shea Evans, MD  metoprolol succinate (TOPROL-XL) 50 MG 24 hr tablet TAKE 1 TABLET BY MOUTH DAILY WITH OR IMMEDIATELY FOLLOWING A MEAL 02/09/21 02/09/22  Sharlene Dory, DO  metroNIDAZOLE (FLAGYL) 500 MG tablet TAKE 4 TABLETS BY MOUTH ONCE AT ONE TIME 01/04/21 01/04/22  Shea Evans, MD    Allergies    Sulfa antibiotics  Review of Systems   Review of Systems  Constitutional: Positive for activity change.  Respiratory: Negative for cough and shortness of breath.   Cardiovascular: Negative for chest pain.  Gastrointestinal: Negative for diarrhea, nausea and vomiting.  Genitourinary: Negative for dysuria and flank pain.  Musculoskeletal: Negative for neck pain.  Skin: Negative for rash.  Neurological: Negative for headaches.  All other systems reviewed and are negative.   Physical Exam Updated Vital Signs BP 116/73   Pulse (!) 112   Temp 100.2 F (37.9 C) (Oral)   Resp 18   LMP 02/23/2021   SpO2 99%   Physical Exam Vitals and nursing note reviewed.  Constitutional:  Appearance: She is well-developed.  HENT:     Head: Normocephalic and atraumatic.     Mouth/Throat:     Pharynx: No oropharyngeal exudate or posterior oropharyngeal erythema.  Cardiovascular:     Rate and Rhythm: Normal rate.  Pulmonary:     Effort: Pulmonary effort is normal.  Abdominal:     General: Bowel sounds are normal.  Musculoskeletal:     Cervical back: Normal range of motion and neck supple.  Lymphadenopathy:     Cervical: No cervical adenopathy.  Skin:    General: Skin is warm and dry.  Neurological:     Mental Status: She is alert and  oriented to person, place, and time.     ED Results / Procedures / Treatments   Labs (all labs ordered are listed, but only abnormal results are displayed) Labs Reviewed  URINALYSIS, ROUTINE W REFLEX MICROSCOPIC - Abnormal; Notable for the following components:      Result Value   APPearance HAZY (*)    Hgb urine dipstick LARGE (*)    Ketones, ur 40 (*)    Leukocytes,Ua TRACE (*)    All other components within normal limits  URINALYSIS, MICROSCOPIC (REFLEX) - Abnormal; Notable for the following components:   Bacteria, UA FEW (*)    All other components within normal limits  RESP PANEL BY RT-PCR (FLU A&B, COVID) [id number]  PREGNANCY, URINE    EKG None  Radiology No results found.  Procedures Procedures   Medications Ordered in ED Medications  acetaminophen (TYLENOL) tablet 650 mg (650 mg Oral Given 03/02/21 1915)    ED Course  I have reviewed the triage vital signs and the nursing notes.  Pertinent labs & imaging results that were available during my care of the patient were reviewed by me and considered in my medical decision making (see chart for details).    MDM Rules/Calculators/A&P                          25 year old female comes in with chief complaint of fever.  She has some he flulike symptoms with chills, aches, reduced p.o. intake. History is not suggestive of any specific source for infection clearly.  She is vaccinated, it is possible that she has COVID.  COVID test sent.  Reassessment time, patient's fever had come down, COVID-19 test, UA is negative.  We repeated history to ensure that were not missing any other source of infection and patient continues to have negatives across review of system to many organ system.  She is immunocompetent.  She asked if labs will be necessary, informed her unlikely given that elevated white count would hardly have any impact on her decision making.  We have agreed to wait and watch approach, she will take supportive  treatment for now and return to the ER if there is any worsening symptoms or new symptoms.  Final Clinical Impression(s) / ED Diagnoses Final diagnoses:  Acute febrile illness    Rx / DC Orders ED Discharge Orders         Ordered    acetaminophen (TYLENOL 8 HOUR) 650 MG CR tablet  Every 8 hours        03/02/21 2146           Derwood Kaplan, MD 03/04/21 [phone]

## 2021-03-02 NOTE — Discharge Instructions (Signed)
We saw in the ER for fevers and weakness.  COVID-19 test and urinalysis are not indicative of any infection.  Flu test is also negative.  It is unclear to [place] what is driving her fever.  We suspect a viral infection that should clear on its own.  We recommend that you push plenty of fluids and take Tylenol for fevers.  See your doctor in 1 week.  Please return to the ER if your symptoms worsen; you have increased pain, fevers, chills, inability to keep any medications down, confusion. Otherwise see the outpatient doctor as requested.

## 2021-03-03 ENCOUNTER — Other Ambulatory Visit (HOSPITAL_BASED_OUTPATIENT_CLINIC_OR_DEPARTMENT_OTHER): Payer: Self-pay

## 2021-03-11 ENCOUNTER — Other Ambulatory Visit: Payer: Self-pay

## 2021-03-11 ENCOUNTER — Other Ambulatory Visit (HOSPITAL_BASED_OUTPATIENT_CLINIC_OR_DEPARTMENT_OTHER): Payer: Self-pay

## 2021-03-11 DIAGNOSIS — I10 Essential (primary) hypertension: Secondary | ICD-10-CM

## 2021-03-11 MED ORDER — AMLODIPINE BESYLATE 5 MG PO TABS: ORAL_TABLET | Freq: Every day | ORAL | 3 refills | Status: DC

## 2021-03-11 MED ORDER — AMLODIPINE BESYLATE 5 MG PO TABS
5.0000 mg | ORAL_TABLET | Freq: Every day | ORAL | 3 refills | Status: DC
  Filled 2021-03-11: qty 30, 30d supply, fill #0

## 2021-03-11 NOTE — Addendum Note (Signed)
Addended by: Mertha Finders on: 03/11/2021 08:44 AM   Modules accepted: Orders

## 2021-03-16 ENCOUNTER — Other Ambulatory Visit (HOSPITAL_BASED_OUTPATIENT_CLINIC_OR_DEPARTMENT_OTHER): Payer: Self-pay

## 2021-04-08 ENCOUNTER — Other Ambulatory Visit: Payer: Self-pay

## 2021-04-08 ENCOUNTER — Other Ambulatory Visit (HOSPITAL_BASED_OUTPATIENT_CLINIC_OR_DEPARTMENT_OTHER): Payer: Self-pay

## 2021-04-08 DIAGNOSIS — I10 Essential (primary) hypertension: Secondary | ICD-10-CM

## 2021-04-08 MED ORDER — AMLODIPINE BESYLATE 5 MG PO TABS
5.0000 mg | ORAL_TABLET | Freq: Every day | ORAL | 1 refills | Status: DC
  Filled 2021-04-08: qty 30, 30d supply, fill #0

## 2021-04-08 MED ORDER — METOPROLOL SUCCINATE ER 50 MG PO TB24
ORAL_TABLET | ORAL | 1 refills | Status: DC
  Filled 2021-04-08: qty 30, 30d supply, fill #0

## 2021-05-10 ENCOUNTER — Other Ambulatory Visit (HOSPITAL_BASED_OUTPATIENT_CLINIC_OR_DEPARTMENT_OTHER): Payer: Self-pay

## 2021-05-10 ENCOUNTER — Other Ambulatory Visit: Payer: Self-pay

## 2021-05-10 MED ORDER — AMLODIPINE BESYLATE 5 MG PO TABS
5.0000 mg | ORAL_TABLET | Freq: Every day | ORAL | 1 refills | Status: DC
  Filled 2021-05-10: qty 30, 30d supply, fill #0
  Filled 2021-05-11: qty 90, 90d supply, fill #0

## 2021-05-11 ENCOUNTER — Other Ambulatory Visit (HOSPITAL_BASED_OUTPATIENT_CLINIC_OR_DEPARTMENT_OTHER): Payer: Self-pay

## 2021-05-13 ENCOUNTER — Ambulatory Visit: Payer: 59 | Attending: Internal Medicine

## 2021-05-13 DIAGNOSIS — Z23 Encounter for immunization: Secondary | ICD-10-CM

## 2021-05-13 NOTE — Progress Notes (Signed)
   Covid-19 Vaccination Clinic  Name:  Monique Turner    MRN: [id number] DOB: [birth year]  05/13/2021  [name] was observed post Covid-19 immunization for 15 minutes without incident. She was provided with Vaccine Information Sheet and instruction to access the V-Safe system.   [name] was instructed to call 911 with any severe reactions post vaccine: Difficulty breathing  Swelling of face and throat  A fast heartbeat  A bad rash all over body  Dizziness and weakness   Immunizations Administered     Name Date Dose VIS Date Route   PFIZER Comrnaty(Gray TOP) Covid-19 Vaccine 05/13/2021 12:53 PM 0.3 mL 09/16/2020 Intramuscular   Manufacturer: [employer], [employer]   Lot: [id number]   NDC: [phone]

## 2021-05-16 ENCOUNTER — Other Ambulatory Visit (HOSPITAL_BASED_OUTPATIENT_CLINIC_OR_DEPARTMENT_OTHER): Payer: Self-pay

## 2021-05-16 MED ORDER — COVID-19 MRNA VAC-TRIS(PFIZER) 30 MCG/0.3ML IM SUSP
INTRAMUSCULAR | 0 refills | Status: DC
  Filled 2021-05-16: qty 0.3, 1d supply, fill #0

## 2021-05-17 ENCOUNTER — Other Ambulatory Visit (HOSPITAL_BASED_OUTPATIENT_CLINIC_OR_DEPARTMENT_OTHER): Payer: Self-pay

## 2021-05-17 ENCOUNTER — Other Ambulatory Visit: Payer: Self-pay | Admitting: Family Medicine

## 2021-05-17 DIAGNOSIS — I10 Essential (primary) hypertension: Secondary | ICD-10-CM

## 2021-05-17 MED ORDER — METOPROLOL SUCCINATE ER 50 MG PO TB24
ORAL_TABLET | ORAL | 1 refills | Status: DC
  Filled 2021-05-17: qty 31, 31d supply, fill #0

## 2021-05-30 ENCOUNTER — Telehealth: Payer: Self-pay | Admitting: Family Medicine

## 2021-05-30 ENCOUNTER — Other Ambulatory Visit (HOSPITAL_BASED_OUTPATIENT_CLINIC_OR_DEPARTMENT_OTHER): Payer: Self-pay

## 2021-05-30 MED ORDER — SCOPOLAMINE 1 MG/3DAYS TD PT72
1.0000 | MEDICATED_PATCH | TRANSDERMAL | 1 refills | Status: DC
Start: 2021-05-30 — End: 2021-06-30
  Filled 2021-05-30: qty 10, 30d supply, fill #0

## 2021-05-30 NOTE — Telephone Encounter (Signed)
Patient requesting patches for her trip sent to [facility]

## 2021-05-31 ENCOUNTER — Other Ambulatory Visit (HOSPITAL_BASED_OUTPATIENT_CLINIC_OR_DEPARTMENT_OTHER): Payer: Self-pay

## 2021-06-14 ENCOUNTER — Other Ambulatory Visit (HOSPITAL_BASED_OUTPATIENT_CLINIC_OR_DEPARTMENT_OTHER): Payer: Self-pay

## 2021-06-16 ENCOUNTER — Other Ambulatory Visit (HOSPITAL_BASED_OUTPATIENT_CLINIC_OR_DEPARTMENT_OTHER): Payer: Self-pay

## 2021-06-16 ENCOUNTER — Other Ambulatory Visit: Payer: Self-pay

## 2021-06-16 DIAGNOSIS — I10 Essential (primary) hypertension: Secondary | ICD-10-CM

## 2021-06-16 MED ORDER — METOPROLOL SUCCINATE ER 50 MG PO TB24
ORAL_TABLET | ORAL | 1 refills | Status: DC
  Filled 2021-06-16: qty 30, 30d supply, fill #0

## 2021-06-30 ENCOUNTER — Ambulatory Visit: Payer: 59 | Admitting: Nurse Practitioner

## 2021-06-30 ENCOUNTER — Other Ambulatory Visit: Payer: Self-pay

## 2021-06-30 ENCOUNTER — Encounter: Payer: Self-pay | Admitting: Nurse Practitioner

## 2021-06-30 VITALS — BP 118/74 | HR 85 | Temp 97.4°F | Resp 17 | Ht 61.5 in | Wt 198.0 lb

## 2021-06-30 DIAGNOSIS — I10 Essential (primary) hypertension: Secondary | ICD-10-CM | POA: Diagnosis not present

## 2021-06-30 NOTE — Assessment & Plan Note (Addendum)
Diagnosed in 2021 BP at goal with metoprolol and amlodipine She wants to decrease number of medications used. Has not made any dietary or lifestyle modifications. FHx of DM, HTN, premature CAD and MI. BP Readings from Last 3 Encounters:  06/30/21 118/74  03/02/21 116/73  08/16/20 (!) 182/120   Advised about the importance of contraception while taking current medications. She opted to use condoms. Advised about the importance of DASH diet and regular exercise. Printed information provided. Advised to minimize alcohol consumption. Maintain current medications and start BP check in AM Will reconsider medications after review of BP recordings F/up in 2-4weeks

## 2021-06-30 NOTE — Patient Instructions (Signed)
Maintain current medication dose Use condom for contraception Start daily exercise and DASH diet Check BP in Am and record. Bring BP reading to your next office visit.  DASH Eating Plan DASH stands for Dietary Approaches to Stop Hypertension. The DASH eating plan is a healthy eating plan that has been shown to: Reduce high blood pressure (hypertension). Reduce your risk for type 2 diabetes, heart disease, and stroke. Help with weight loss. What are tips for following this plan? Reading food labels Check food labels for the amount of salt (sodium) per serving. Choose foods with less than 5 percent of the Daily Value of sodium. Generally, foods with less than 300 milligrams (mg) of sodium per serving fit into this eating plan. To find whole grains, look for the word "whole" as the first word in the ingredient list. Shopping Buy products labeled as "low-sodium" or "no salt added." Buy fresh foods. Avoid canned foods and pre-made or frozen meals. Cooking Avoid adding salt when cooking. Use salt-free seasonings or herbs instead of table salt or sea salt. Check with your health care provider or pharmacist before using salt substitutes. Do not fry foods. Cook foods using healthy methods such as baking, boiling, grilling, roasting, and broiling instead. Cook with heart-healthy oils, such as olive, canola, avocado, soybean, or sunflower oil. Meal planning  Eat a balanced diet that includes: 4 or more servings of fruits and 4 or more servings of vegetables each day. Try to fill one-half of your plate with fruits and vegetables. 6-8 servings of whole grains each day. Less than 6 oz (170 g) of lean meat, poultry, or fish each day. A 3-oz (85-g) serving of meat is about the same size as a deck of cards. One egg equals 1 oz (28 g). 2-3 servings of low-fat dairy each day. One serving is 1 cup (237 mL). 1 serving of nuts, seeds, or beans 5 times each week. 2-3 servings of heart-healthy fats. Healthy  fats called omega-3 fatty acids are found in foods such as walnuts, flaxseeds, fortified milks, and eggs. These fats are also found in cold-water fish, such as sardines, salmon, and mackerel. Limit how much you eat of: Canned or prepackaged foods. Food that is high in trans fat, such as some fried foods. Food that is high in saturated fat, such as fatty meat. Desserts and other sweets, sugary drinks, and other foods with added sugar. Full-fat dairy products. Do not salt foods before eating. Do not eat more than 4 egg yolks a week. Try to eat at least 2 vegetarian meals a week. Eat more home-cooked food and less restaurant, buffet, and fast food. Lifestyle When eating at a restaurant, ask that your food be prepared with less salt or no salt, if possible. If you drink alcohol: Limit how much you use to: 0-1 drink a day for women who are not pregnant. 0-2 drinks a day for men. Be aware of how much alcohol is in your drink. In the [place] one drink equals one 12 oz bottle of beer (355 mL), one 5 oz glass of wine (148 mL), or one 1 oz glass of hard liquor (44 mL). General information Avoid eating more than 2,300 mg of salt a day. If you have hypertension, you may need to reduce your sodium intake to 1,500 mg a day. Work with your health care provider to maintain a healthy body weight or to lose weight. Ask what an ideal weight is for you. Get at least 30 minutes of exercise that  causes your heart to beat faster (aerobic exercise) most days of the week. Activities may include walking, swimming, or biking. Work with your health care provider or dietitian to adjust your eating plan to your individual calorie needs. What foods should I eat? Fruits All fresh, dried, or frozen fruit. Canned fruit in natural juice (without added sugar). Vegetables Fresh or frozen vegetables (raw, steamed, roasted, or grilled). Low-sodium or reduced-sodium tomato and vegetable juice. Low-sodium or reduced-sodium tomato  sauce and tomato paste. Low-sodium or reduced-sodium canned vegetables. Grains Whole-grain or whole-wheat bread. Whole-grain or whole-wheat pasta. Brown rice. Monique Turner. Bulgur. Whole-grain and low-sodium cereals. Pita bread. Low-fat, low-sodium crackers. Whole-wheat flour tortillas. Meats and other proteins Skinless chicken or [place]. Ground chicken or [place]. Pork with fat trimmed off. Fish and seafood. Egg whites. Dried beans, peas, or lentils. Unsalted nuts, nut butters, and seeds. Unsalted canned beans. Lean cuts of beef with fat trimmed off. Low-sodium, lean precooked or cured meat, such as sausages or meat loaves. Dairy Low-fat (1%) or fat-free (skim) milk. Reduced-fat, low-fat, or fat-free cheeses. Nonfat, low-sodium ricotta or cottage cheese. Low-fat or nonfat yogurt. Low-fat, low-sodium cheese. Fats and oils Soft margarine without trans fats. Vegetable oil. Reduced-fat, low-fat, or light mayonnaise and salad dressings (reduced-sodium). Canola, safflower, olive, avocado, soybean, and sunflower oils. Avocado. Seasonings and condiments Herbs. Spices. Seasoning mixes without salt. Other foods Unsalted popcorn and pretzels. Fat-free sweets. The items listed above may not be a complete list of foods and beverages you can eat. Contact a dietitian for more information. What foods should I avoid? Fruits Canned fruit in a light or heavy syrup. Fried fruit. Fruit in cream or butter sauce. Vegetables Creamed or fried vegetables. Vegetables in a cheese sauce. Regular canned vegetables (not low-sodium or reduced-sodium). Regular canned tomato sauce and paste (not low-sodium or reduced-sodium). Regular tomato and vegetable juice (not low-sodium or reduced-sodium). Monique Turner. Olives. Grains Baked goods made with fat, such as croissants, muffins, or some breads. Dry pasta or rice meal packs. Meats and other proteins Fatty cuts of meat. Ribs. Fried meat. Monique Turner. Bologna, salami, and other precooked  or cured meats, such as sausages or meat loaves. Fat from the back of a pig (fatback). Bratwurst. Salted nuts and seeds. Canned beans with added salt. Canned or smoked fish. Whole eggs or egg yolks. Chicken or [place] with skin. Dairy Whole or 2% milk, cream, and half-and-half. Whole or full-fat cream cheese. Whole-fat or sweetened yogurt. Full-fat cheese. Nondairy creamers. Whipped toppings. Processed cheese and cheese spreads. Fats and oils Butter. Stick margarine. Lard. Shortening. Ghee. Bacon fat. Tropical oils, such as coconut, palm kernel, or palm oil. Seasonings and condiments Onion salt, garlic salt, seasoned salt, table salt, and sea salt. Worcestershire sauce. Tartar sauce. Barbecue sauce. Teriyaki sauce. Soy sauce, including reduced-sodium. Steak sauce. Canned and packaged gravies. Fish sauce. Oyster sauce. Cocktail sauce. Store-bought horseradish. Ketchup. Mustard. Meat flavorings and tenderizers. Bouillon cubes. Hot sauces. Pre-made or packaged marinades. Pre-made or packaged taco seasonings. Relishes. Regular salad dressings. Other foods Salted popcorn and pretzels. The items listed above may not be a complete list of foods and beverages you should avoid. Contact a dietitian for more information. Where to find more information National Heart, Lung, and Blood Institute: PopSteam.is American Heart Association: www.heart.org Academy of Nutrition and Dietetics: www.eatright.org National Kidney Foundation: www.kidney.org Summary The DASH eating plan is a healthy eating plan that has been shown to reduce high blood pressure (hypertension). It may also reduce your risk for type 2 diabetes, heart  disease, and stroke. When on the DASH eating plan, aim to eat more fresh fruits and vegetables, whole grains, lean proteins, low-fat dairy, and heart-healthy fats. With the DASH eating plan, you should limit salt (sodium) intake to 2,300 mg a day. If you have hypertension, you may need to reduce  your sodium intake to 1,500 mg a day. Work with your health care provider or dietitian to adjust your eating plan to your individual calorie needs. This information is not intended to replace advice given to you by your health care provider. Make sure you discuss any questions you have with your health care provider. Document Revised: 08/29/2019 Document Reviewed: 08/29/2019 Elsevier Patient Education  2022 [employer].  Exercising to Stay Healthy To become healthy and stay healthy, it is recommended that you do moderate-intensity and vigorous-intensity exercise. You can tell that you are exercising at a moderate intensity if your heart starts beating faster and you start breathing faster but can still hold a conversation. You can tell that you are exercising at a vigorous intensity if you are breathing much harder and faster and cannot hold a conversation while exercising. How can exercise benefit me? Exercising regularly is important. It has many health benefits, such as: Improving overall fitness, flexibility, and endurance. Increasing bone density. Helping with weight control. Decreasing body fat. Increasing muscle strength and endurance. Reducing stress and tension, anxiety, depression, or anger. Improving overall health. What guidelines should I follow while exercising? Before you start a new exercise program, talk with your health care provider. Do not exercise so much that you hurt yourself, feel dizzy, or get very short of breath. Wear comfortable clothes and wear shoes with good support. Drink plenty of water while you exercise to prevent dehydration or heat stroke. Work out until your breathing and your heartbeat get faster (moderate intensity). How often should I exercise? Choose an activity that you enjoy, and set realistic goals. Your health care provider can help you make an activity plan that is individually designed and works best for you. Exercise regularly as told by  your health care provider. This may include: Doing strength training two times a week, such as: Lifting weights. Using resistance bands. Push-ups. Sit-ups. Yoga. Doing a certain intensity of exercise for a given amount of time. Choose from these options: A total of 150 minutes of moderate-intensity exercise every week. A total of 75 minutes of vigorous-intensity exercise every week. A mix of moderate-intensity and vigorous-intensity exercise every week. Children, pregnant women, people who have not exercised regularly, people who are overweight, and older adults may need to talk with a health care provider about what activities are safe to perform. If you have a medical condition, be sure to talk with your health care provider before you start a new exercise program. What are some exercise ideas? Moderate-intensity exercise ideas include: Walking 1 mile (1.6 km) in about 15 minutes. Biking. Hiking. Golfing. Dancing. Water aerobics. Vigorous-intensity exercise ideas include: Walking 4.5 miles (7.2 km) or more in about 1 hour. Jogging or running 5 miles (8 km) in about 1 hour. Biking 10 miles (16.1 km) or more in about 1 hour. Lap swimming. Roller-skating or in-line skating. Cross-country skiing. Vigorous competitive sports, such as football, basketball, and soccer. Jumping rope. Aerobic dancing. What are some everyday activities that can help me get exercise? Yard work, such as: Child psychotherapist. Raking and bagging leaves. Washing your car. Pushing a stroller. Shoveling snow. Gardening. Washing windows or floors. How can I be  more active in my day-to-day activities? Use stairs instead of an elevator. Take a walk during your lunch break. If you drive, park your car farther away from your work or school. If you take public transportation, get off one stop early and walk the rest of the way. Stand up or walk around during all of your indoor phone calls. Get up, stretch,  and walk around every 30 minutes throughout the day. Enjoy exercise with a friend. Support to continue exercising will help you keep a regular routine of activity. Where to find more information You can find more information about exercising to stay healthy from: [place].S. Department of Health and Human Services: ThisPath.fi Centers for Disease Control and Prevention (CDC): FootballExhibition.com.br Summary Exercising regularly is important. It will improve your overall fitness, flexibility, and endurance. Regular exercise will also improve your overall health. It can help you control your weight, reduce stress, and improve your bone density. Do not exercise so much that you hurt yourself, feel dizzy, or get very short of breath. Before you start a new exercise program, talk with your health care provider. This information is not intended to replace advice given to you by your health care provider. Make sure you discuss any questions you have with your health care provider. Document Revised: 01/21/2021 Document Reviewed: 01/21/2021 Elsevier Patient Education  2022 [employer].

## 2021-06-30 NOTE — Progress Notes (Signed)
Subjective:  Patient ID: Monique Turner, female    DOB: 12/31/1995  Age: 25 y.o. MRN: [id number]  CC: Establish Care (Pt would like to discuss Blood pressure todays 118/74, pt is not fasting, pt declines flu shot on today. )  HPI  Essential hypertension Diagnosed in 2021 BP at goal with metoprolol and amlodipine She wants to decrease number of medications used. Has not made any dietary or lifestyle modifications. FHx of DM, HTN, premature CAD and MI. BP Readings from Last 3 Encounters:  06/30/21 118/74  03/02/21 116/73  08/16/20 (!) 182/120   Advised about the importance of contraception while taking current medications. She opted to use condoms. Advised about the importance of DASH diet and regular exercise. Printed information provided. Advised to minimize alcohol consumption. Maintain current medications and start BP check in AM Will reconsider medications after review of BP recordings F/up in 2-4weeks  BP Readings from Last 3 Encounters:  06/30/21 118/74  03/02/21 116/73  08/16/20 (!) 182/120    Wt Readings from Last 3 Encounters:  06/30/21 198 lb (89.8 kg)  08/16/20 174 lb (78.9 kg)  05/13/20 177 lb 6.4 oz (80.5 kg)    Reviewed past Medical, Social and Family history today.  Outpatient Medications Prior to Visit  Medication Sig Dispense Refill   acetaminophen (TYLENOL 8 HOUR) 650 MG CR tablet Take 1 tablet (650 mg total) by mouth every 8 (eight) hours. 30 tablet 0   amLODipine (NORVASC) 5 MG tablet Take 1 tablet (5 mg total) by mouth daily. 90 tablet 1   ibuprofen (ADVIL) 600 MG tablet TAKE 1 TABLET BY MOUTH EVERY 8 HOURS AS NEEDED 30 tablet 0   metoprolol succinate (TOPROL-XL) 50 MG 24 hr tablet TAKE 1 TABLET BY MOUTH DAILY WITH OR IMMEDIATELY FOLLOWING A MEAL 30 tablet 1   COVID-19 mRNA Vac-TriS, Pfizer, SUSP injection Inject into the muscle. (Patient not taking: Reported on 06/30/2021) 0.3 mL 0   medroxyPROGESTERone (PROVERA) 5 MG tablet TAKE 1 TABLET BY MOUTH  EVERY DAY FOR 7 DAYS. (7 DAYS EACH MONTH FROM THE 20TH OF EACH MONTH) 21 tablet 4   medroxyPROGESTERone (PROVERA) 5 MG tablet TAKE 1 TABLET BY MOUTH EVERY DAY FOR 7 DAYS. (7 DAYS EACH MONTH FROM THE 20TH OF EACH MONTH) 21 tablet 1   metroNIDAZOLE (FLAGYL) 500 MG tablet TAKE 4 TABLETS BY MOUTH ONCE AT ONE TIME 4 tablet 0   scopolamine (TRANSDERM-SCOP, 1.5 MG,) 1 MG/3DAYS Place 1 patch (1.5 mg total) onto the skin every 3 (three) days. 10 patch 1   No facility-administered medications prior to visit.    ROS See HPI  Objective:  BP 118/74 (BP Location: Left Arm, Patient Position: Sitting, Cuff Size: Large)   Pulse 85   Temp (!) 97.4 F (36.3 C) (Temporal)   Resp 17   Ht 5' 1.5" (1.562 m)   Wt 198 lb (89.8 kg)   LMP 06/08/2021 (Exact Date)   SpO2 100%   BMI 36.81 kg/m   Physical Exam Vitals reviewed.  Cardiovascular:     Rate and Rhythm: Normal rate and regular rhythm.     Pulses: Normal pulses.     Heart sounds: Normal heart sounds.  Pulmonary:     Effort: Pulmonary effort is normal.     Breath sounds: Normal breath sounds.  Musculoskeletal:     Right lower leg: No edema.     Left lower leg: No edema.  Neurological:     Mental Status: She is alert and oriented  to person, place, and time.    Assessment & Plan:  This visit occurred during the SARS-CoV-2 public health emergency.  Safety protocols were in place, including screening questions prior to the visit, additional usage of staff PPE, and extensive cleaning of exam room while observing appropriate contact time as indicated for disinfecting solutions.   Akua was seen today for establish care.  Diagnoses and all orders for this visit:  Essential hypertension  Problem List Items Addressed This Visit       Cardiovascular and Mediastinum   Essential hypertension - Primary    Diagnosed in 2021 BP at goal with metoprolol and amlodipine She wants to decrease number of medications used. Has not made any dietary or  lifestyle modifications. FHx of DM, HTN, premature CAD and MI. BP Readings from Last 3 Encounters:  06/30/21 118/74  03/02/21 116/73  08/16/20 (!) 182/120   Advised about the importance of contraception while taking current medications. She opted to use condoms. Advised about the importance of DASH diet and regular exercise. Printed information provided. Advised to minimize alcohol consumption. Maintain current medications and start BP check in AM Will reconsider medications after review of BP recordings F/up in 2-4weeks        Follow-up: Return in about 2 weeks (around 07/14/2021) for CPE and HTN f/up (fasting).  Alysia Penna, NP

## 2021-07-01 ENCOUNTER — Other Ambulatory Visit: Payer: 59

## 2021-07-15 ENCOUNTER — Other Ambulatory Visit (HOSPITAL_BASED_OUTPATIENT_CLINIC_OR_DEPARTMENT_OTHER): Payer: Self-pay

## 2021-07-15 ENCOUNTER — Other Ambulatory Visit: Payer: Self-pay | Admitting: *Deleted

## 2021-07-15 DIAGNOSIS — I10 Essential (primary) hypertension: Secondary | ICD-10-CM

## 2021-07-15 MED ORDER — METOPROLOL SUCCINATE ER 50 MG PO TB24
ORAL_TABLET | ORAL | 0 refills | Status: DC
  Filled 2021-07-15: qty 30, 30d supply, fill #0
  Filled 2021-08-17: qty 30, 30d supply, fill #1
  Filled 2021-09-19: qty 30, 30d supply, fill #2

## 2021-07-21 ENCOUNTER — Other Ambulatory Visit: Payer: Self-pay

## 2021-07-21 ENCOUNTER — Ambulatory Visit (INDEPENDENT_AMBULATORY_CARE_PROVIDER_SITE_OTHER): Payer: 59 | Admitting: Nurse Practitioner

## 2021-07-21 ENCOUNTER — Encounter: Payer: Self-pay | Admitting: Nurse Practitioner

## 2021-07-21 ENCOUNTER — Other Ambulatory Visit (HOSPITAL_BASED_OUTPATIENT_CLINIC_OR_DEPARTMENT_OTHER): Payer: Self-pay

## 2021-07-21 VITALS — BP 118/80 | HR 83 | Temp 97.6°F | Ht 61.25 in | Wt 198.6 lb

## 2021-07-21 DIAGNOSIS — Z13220 Encounter for screening for lipoid disorders: Secondary | ICD-10-CM | POA: Diagnosis not present

## 2021-07-21 DIAGNOSIS — Z0000 Encounter for general adult medical examination without abnormal findings: Secondary | ICD-10-CM

## 2021-07-21 DIAGNOSIS — I10 Essential (primary) hypertension: Secondary | ICD-10-CM

## 2021-07-21 DIAGNOSIS — Z8249 Family history of ischemic heart disease and other diseases of the circulatory system: Secondary | ICD-10-CM | POA: Insufficient documentation

## 2021-07-21 DIAGNOSIS — E7800 Pure hypercholesterolemia, unspecified: Secondary | ICD-10-CM | POA: Diagnosis not present

## 2021-07-21 DIAGNOSIS — Z136 Encounter for screening for cardiovascular disorders: Secondary | ICD-10-CM

## 2021-07-21 HISTORY — DX: Family history of ischemic heart disease and other diseases of the circulatory system: Z82.49

## 2021-07-21 LAB — LIPID PANEL
Cholesterol: 233 mg/dL — ABNORMAL HIGH (ref 0–200)
HDL: 62.1 mg/dL (ref >39.00)
LDL Cholesterol: 162 mg/dL — ABNORMAL HIGH (ref 0–99)
NonHDL: 170.78
Total CHOL/HDL Ratio: 4
Triglycerides: 43 mg/dL (ref 0.0–149.0)
VLDL: 8.6 mg/dL (ref 0.0–40.0)

## 2021-07-21 LAB — TSH: TSH: 0.68 u[IU]/mL (ref 0.35–5.50)

## 2021-07-21 LAB — CBC WITH DIFFERENTIAL/PLATELET
Basophils Absolute: 0.1 10*3/uL (ref 0.0–0.1)
Basophils Relative: 0.9 % (ref 0.0–3.0)
Eosinophils Absolute: 0.1 10*3/uL (ref 0.0–0.7)
Eosinophils Relative: 1.1 % (ref 0.0–5.0)
HCT: 38 % (ref 36.0–46.0)
Hemoglobin: 12.2 g/dL (ref 12.0–15.0)
Lymphocytes Relative: 42.9 % (ref 12.0–46.0)
Lymphs Abs: 2.7 10*3/uL (ref 0.7–4.0)
MCHC: 32.1 g/dL (ref 30.0–36.0)
MCV: 82 fl (ref 78.0–100.0)
Monocytes Absolute: 0.5 10*3/uL (ref 0.1–1.0)
Monocytes Relative: 8.4 % (ref 3.0–12.0)
Neutro Abs: 2.9 10*3/uL (ref 1.4–7.7)
Neutrophils Relative %: 46.7 % (ref 43.0–77.0)
Platelets: 399 10*3/uL (ref 150.0–400.0)
RBC: 4.63 Mil/uL (ref 3.87–5.11)
RDW: 15.8 % — ABNORMAL HIGH (ref 11.5–15.5)
WBC: 6.2 10*3/uL (ref 4.0–10.5)

## 2021-07-21 LAB — COMPREHENSIVE METABOLIC PANEL
ALT: 12 U/L (ref 0–35)
AST: 20 U/L (ref 0–37)
Albumin: 4.4 g/dL (ref 3.5–5.2)
Alkaline Phosphatase: 63 U/L (ref 39–117)
BUN: 7 mg/dL (ref 6–23)
CO2: 29 mEq/L (ref 19–32)
Calcium: 9.8 mg/dL (ref 8.4–10.5)
Chloride: 102 mEq/L (ref 96–112)
Creatinine, Ser: 0.73 mg/dL (ref 0.40–1.20)
GFR: 113.95 mL/min (ref >60.00)
Glucose, Bld: 76 mg/dL (ref 70–99)
Potassium: 4 mEq/L (ref 3.5–5.1)
Sodium: 139 mEq/L (ref 135–145)
Total Bilirubin: 0.3 mg/dL (ref 0.2–1.2)
Total Protein: 7.5 g/dL (ref 6.0–8.3)

## 2021-07-21 MED ORDER — AMLODIPINE BESYLATE 2.5 MG PO TABS
2.5000 mg | ORAL_TABLET | Freq: Every day | ORAL | 3 refills | Status: DC
  Filled 2021-07-21: qty 30, 30d supply, fill #0
  Filled 2021-08-17 – 2021-08-25 (×2): qty 30, 30d supply, fill #1
  Filled 2021-09-23: qty 30, 30d supply, fill #2

## 2021-07-21 NOTE — Assessment & Plan Note (Signed)
Home BP readings: 90s-100/80s-90s  no fatigue, no dizziness BP Readings from Last 3 Encounters:  07/21/21 118/80  06/30/21 118/74  03/02/21 116/73   Decrease amlodipine to 2.5mg  daily Maintain metoprolol dose Maintain DASH diet ad start daily exercise Send BP readings via mychart in 2weeks. F/up in office in 30months

## 2021-07-21 NOTE — Patient Instructions (Addendum)
Go to lab for blood draw.  Decrease amlodipine dose to 2.24m daily Maintain metoprolol dose Start daily exercise and maintain heart healthy diet Monitor BP 2-3x/week in AM Send reading via mychart in 2weeks  Sign medical release to get records from GYN.  Preventive Care [age] Old, Female Preventive care refers to lifestyle choices and visits with your health care provider that can promote health and wellness. At this stage in your life, you may start seeing a primary care physician instead of a pediatrician. It is important to take responsibility for your health and well-being. Preventive care for young adults includes: A yearly physical exam. This is also called an annual wellness visit. Regular dental and eye exams. Immunizations. Screening for certain conditions. Healthy lifestyle choices, such as: Eating a healthy diet. Getting regular exercise. Not using drugs or products that contain nicotine and tobacco. Limiting alcohol use. What can I expect for my preventive care visit? Physical exam Your health care provider may check your: Height and weight. These may be used to calculate your BMI (body mass index). BMI is a measurement that tells if you are at a healthy weight. Heart rate and blood pressure. Body temperature. Skin for abnormal spots. Counseling Your health care provider may ask you questions about your: Past medical problems. Family's medical history. Alcohol, tobacco, and drug use. Home life and relationship well-being. Access to firearms. Emotional well-being. Diet, exercise, and sleep habits. Sexual activity and sexual health. Method of birth control. Menstrual cycle. Pregnancy history. What immunizations do I need? Vaccines are usually given at various ages, according to a schedule. Your health care provider will recommend vaccines for you based on your age, medical history, and lifestyle or other factors, such as travel or where you work. What tests  do I need? Blood tests Lipid and cholesterol levels. These may be checked every 5 years starting at age 26 Hepatitis C test. Hepatitis B test. Screening Pelvic exam and Pap test. This may be done every 3 years starting at age 26 STD (sexually transmitted disease) testing, if you are at risk. BRCA-related cancer screening. This may be done if you have a family history of breast, ovarian, tubal, or peritoneal cancers. Other tests Tuberculosis skin test. Vision and hearing tests. Skin exam. Breast exam. Talk with your health care provider about your test results, treatment options, and if necessary, the need for more tests. Follow these instructions at home: Eating and drinking  Eat a healthy diet that includes fresh fruits and vegetables, whole grains, lean protein, and low-fat dairy products. Drink enough fluid to keep your urine pale yellow. Do not drink alcohol if: Your health care provider tells you not to drink. You are pregnant, may be pregnant, or are planning to become pregnant. You are under the legal drinking age. In the [place] the legal drinking age is 2104 If you drink alcohol: Limit how much you use to 0-1 drink a day. Be aware of how much alcohol is in your drink. In the [place] one drink equals one 12 oz bottle of beer (355 mL), one 5 oz glass of wine (148 mL), or one 1 oz glass of hard liquor (44 mL). Lifestyle Take daily care of your teeth and gums. Brush your teeth every morning and night with fluoride toothpaste. Floss one time each day. Stay active. Exercise for at least 30 minutes 5 or more days of the week. Do not use any products that contain nicotine or tobacco, such as cigarettes, e-cigarettes, and chewing tobacco.  If you need help quitting, ask your health care provider. Do not use drugs. If you are sexually active, practice safe sex. Use a condom or other form of protection to prevent STIs (sexually transmitted infections). If you do not wish to become  pregnant, use a form of birth control. If you plan to become pregnant, see your health care provider for a prepregnancy visit. Find healthy ways to cope with stress, such as: Meditation, yoga, or listening to music. Journaling. Talking to a trusted person. Spending time with friends and family. Safety Always wear your seat belt while driving or riding in a vehicle. Do not drive: If you have been drinking alcohol. Do not ride with someone who has been drinking. When you are tired or distracted. While texting. Wear a helmet and other protective equipment during sports activities. If you have firearms in your house, make sure you follow all gun safety procedures. Seek help if you have been bullied, physically abused, or sexually abused. Use the Internet responsibly to avoid dangers, such as online bullying and online sex predators. What's next? Go to your health care provider once a year for an annual wellness visit. Ask your health care provider how often you should have your eyes and teeth checked. Stay up to date on all vaccines. This information is not intended to replace advice given to you by your health care provider. Make sure you discuss any questions you have with your health care provider. Document Revised: 12/03/2020 Document Reviewed: 09/19/2018 Elsevier Patient Education  2022 [employer].

## 2021-07-21 NOTE — Progress Notes (Signed)
Subjective:    Patient ID: Monique Turner, female    DOB: Sep 20, 1996, 25 y.o.   MRN: [id number]  Patient presents today for CPE and eval of chronic conditions  HPI Essential hypertension Home BP readings: 90s-100/80s-90s  no fatigue, no dizziness BP Readings from Last 3 Encounters:  07/21/21 118/80  06/30/21 118/74  03/02/21 116/73   Decrease amlodipine to 2.5mg  daily Maintain metoprolol dose Maintain DASH diet ad start daily exercise Send BP readings via mychart in 2weeks. F/up in office in 14months  Vision:up to date Dental:up to date Diet:low sodium Exercise:none Weight:  Wt Readings from Last 3 Encounters:  07/21/21 198 lb 9.6 oz (90.1 kg)  06/30/21 198 lb (89.8 kg)  08/16/20 174 lb (78.9 kg)    Sexual History (orientation,birth control, marital status, STD):deferred breast and pelvic exam to GYN, denies need for STD screen, reports completed HPV vaccine per patient  Depression/Suicide: Depression screen [facility] 2/9 07/21/2021 06/30/2021 05/13/2020  Decreased Interest 0 0 0  Down, Depressed, Hopeless 0 0 0  PHQ - 2 Score 0 0 0  Altered sleeping 0 0 -  Tired, decreased energy 0 0 -  Change in appetite 0 0 -  Feeling bad or failure about yourself  0 0 -  Trouble concentrating 0 0 -  Moving slowly or fidgety/restless 0 0 -  Suicidal thoughts 0 0 -  PHQ-9 Score 0 0 -  Difficult doing work/chores - Not difficult at all -   Immunizations: (TDAP, Hep C screen, Pneumovax, Influenza, zoster)  Health Maintenance  Topic Date Due   HPV Vaccine (1 - 2-dose series) Never done   Pap Smear  Never done   Pap Smear  Never done   Flu Shot  01/06/2022*   Hepatitis C Screening: USPSTF Recommendation to screen - Ages 18-79 yo.  07/21/2022*   HIV Screening  07/21/2022*   Tetanus Vaccine  06/30/2027   COVID-19 Vaccine  Completed  *Topic was postponed. The date shown is not the original due date.   Fall Risk: Fall Risk  07/21/2021 06/30/2021  Falls in the past year? 0 0  Number  falls in past yr: 0 0  Injury with Fall? 0 0  Risk for fall due to : No Fall Risks -  Follow up Falls evaluation completed -   Medications and allergies reviewed with patient and updated if appropriate.  Patient Active Problem List   Diagnosis Date Noted   Essential hypertension 05/13/2020   Menorrhagia with regular cycle 05/13/2020   Current Outpatient Medications on File Prior to Visit  Medication Sig Dispense Refill   ibuprofen (ADVIL) 600 MG tablet TAKE 1 TABLET BY MOUTH EVERY 8 HOURS AS NEEDED 30 tablet 0   metoprolol succinate (TOPROL-XL) 50 MG 24 hr tablet TAKE 1 TABLET BY MOUTH DAILY WITH OR IMMEDIATELY FOLLOWING A MEAL 90 tablet 0   No current facility-administered medications on file prior to visit.   Past Medical History:  Diagnosis Date   Asthma    History reviewed. No pertinent surgical history.  Social History   Socioeconomic History   Marital status: Single    Spouse name: Not on file   Number of children: Not on file   Years of education: Not on file   Highest education level: Not on file  Occupational History   Not on file  Tobacco Use   Smoking status: Never   Smokeless tobacco: Never  Substance and Sexual Activity   Alcohol use: Not Currently   Drug use:  Never   Sexual activity: Yes    Birth control/protection: Condom  Other Topics Concern   Not on file  Social History Narrative   Not on file   Social Determinants of Health   Financial Resource Strain: Not on file  Food Insecurity: Not on file  Transportation Needs: Not on file  Physical Activity: Not on file  Stress: Not on file  Social Connections: Not on file   Family History  Problem Relation Age of Onset   Hyperlipidemia Father    Hypertension Father    Diabetes Maternal Aunt    Diabetes Maternal Uncle    Hypertension Paternal Aunt    Thyroid disease Paternal Aunt    Heart disease Paternal Uncle    Hypertension Paternal Uncle    Early death Paternal Uncle    Heart disease  Paternal Uncle 42       CAD/MI   Cancer Paternal Grandmother 80       unknown   Early death Paternal Grandfather 67       CAD/MI       Review of Systems  Constitutional:  Negative for fever, malaise/fatigue and weight loss.  HENT:  Negative for congestion and sore throat.   Eyes:        Negative for visual changes  Respiratory:  Negative for cough and shortness of breath.   Cardiovascular:  Negative for chest pain, palpitations and leg swelling.  Gastrointestinal:  Negative for blood in stool, constipation, diarrhea and heartburn.  Genitourinary:  Negative for dysuria, frequency and urgency.  Musculoskeletal:  Negative for falls, joint pain and myalgias.  Skin:  Negative for rash.  Neurological:  Negative for dizziness, sensory change and headaches.  Endo/Heme/Allergies:  Does not bruise/bleed easily.  Psychiatric/Behavioral:  Negative for depression, substance abuse and suicidal ideas. The patient is not nervous/anxious.    Objective:   Vitals:   07/21/21 1138  BP: 118/80  Pulse: 83  Temp: 97.6 F (36.4 C)  SpO2: 100%   Body mass index is 37.22 kg/m.  Physical Examination:  Physical Exam Constitutional:      General: She is not in acute distress.    Appearance: She is obese.  HENT:     Right Ear: Tympanic membrane, ear canal and external ear normal.     Left Ear: Tympanic membrane, ear canal and external ear normal.  Eyes:     General: No scleral icterus.    Extraocular Movements: Extraocular movements intact.     Conjunctiva/sclera: Conjunctivae normal.  Cardiovascular:     Rate and Rhythm: Normal rate and regular rhythm.     Pulses: Normal pulses.     Heart sounds: Normal heart sounds.  Pulmonary:     Effort: Pulmonary effort is normal. No respiratory distress.     Breath sounds: Normal breath sounds.  Abdominal:     General: Bowel sounds are normal. There is no distension.     Palpations: Abdomen is soft.  Genitourinary:    Comments: Deferred breast  and pelvic exam to GYN Musculoskeletal:        General: Normal range of motion.     Cervical back: Normal range of motion and neck supple.  Lymphadenopathy:     Cervical: No cervical adenopathy.  Skin:    General: Skin is warm and dry.  Neurological:     Mental Status: She is alert and oriented to person, place, and time.  Psychiatric:        Mood and Affect: Mood normal.  Behavior: Behavior normal.        Thought Content: Thought content normal.   ASSESSMENT and PLAN: This visit occurred during the SARS-CoV-2 public health emergency.  Safety protocols were in place, including screening questions prior to the visit, additional usage of staff PPE, and extensive cleaning of exam room while observing appropriate contact time as indicated for disinfecting solutions.   Rynn was seen today for annual exam.  Diagnoses and all orders for this visit:  Preventative health care -     Comprehensive metabolic panel -     CBC with Differential/Platelet -     Lipid panel -     TSH  Encounter for lipid screening for cardiovascular disease -     Lipid panel  Essential hypertension -     amLODipine (NORVASC) 2.5 MG tablet; Take 1 tablet (2.5 mg total) by mouth daily.     Problem List Items Addressed This Visit       Cardiovascular and Mediastinum   Essential hypertension    Home BP readings: 90s-100/80s-90s  no fatigue, no dizziness BP Readings from Last 3 Encounters:  07/21/21 118/80  06/30/21 118/74  03/02/21 116/73   Decrease amlodipine to 2.5mg  daily Maintain metoprolol dose Maintain DASH diet ad start daily exercise Send BP readings via mychart in 2weeks. F/up in office in 19months      Relevant Medications   amLODipine (NORVASC) 2.5 MG tablet   Other Visit Diagnoses     Preventative health care    -  Primary   Relevant Orders   Comprehensive metabolic panel   CBC with Differential/Platelet   Lipid panel   TSH   Encounter for lipid screening for  cardiovascular disease       Relevant Orders   Lipid panel       Follow up: Return in about 3 months (around 10/21/2021) for HTN.  Alysia Penna, NP

## 2021-07-25 ENCOUNTER — Encounter: Payer: Self-pay | Admitting: Nurse Practitioner

## 2021-07-25 NOTE — Addendum Note (Signed)
Addended by: Alysia Penna L on: 07/25/2021 03:51 PM   Modules accepted: Orders

## 2021-08-17 ENCOUNTER — Encounter: Payer: Self-pay | Admitting: Nurse Practitioner

## 2021-08-17 ENCOUNTER — Other Ambulatory Visit: Payer: Self-pay | Admitting: Family Medicine

## 2021-08-17 ENCOUNTER — Other Ambulatory Visit (HOSPITAL_BASED_OUTPATIENT_CLINIC_OR_DEPARTMENT_OTHER): Payer: Self-pay

## 2021-08-17 MED ORDER — IBUPROFEN 600 MG PO TABS
ORAL_TABLET | Freq: Three times a day (TID) | ORAL | 0 refills | Status: DC | PRN
  Filled 2021-08-17: qty 30, 10d supply, fill #0

## 2021-08-25 ENCOUNTER — Other Ambulatory Visit (HOSPITAL_BASED_OUTPATIENT_CLINIC_OR_DEPARTMENT_OTHER): Payer: Self-pay

## 2021-09-07 ENCOUNTER — Telehealth (INDEPENDENT_AMBULATORY_CARE_PROVIDER_SITE_OTHER): Payer: 59 | Admitting: Registered Nurse

## 2021-09-07 ENCOUNTER — Other Ambulatory Visit (HOSPITAL_BASED_OUTPATIENT_CLINIC_OR_DEPARTMENT_OTHER): Payer: Self-pay

## 2021-09-07 VITALS — BP 130/70 | HR 85

## 2021-09-07 DIAGNOSIS — R0981 Nasal congestion: Secondary | ICD-10-CM

## 2021-09-07 DIAGNOSIS — R051 Acute cough: Secondary | ICD-10-CM | POA: Diagnosis not present

## 2021-09-07 MED ORDER — DM-GUAIFENESIN ER 30-600 MG PO TB12
1.0000 | ORAL_TABLET | Freq: Two times a day (BID) | ORAL | 0 refills | Status: DC
  Filled 2021-09-07: qty 20, 10d supply, fill #0

## 2021-09-07 MED ORDER — BENZONATATE 200 MG PO CAPS
200.0000 mg | ORAL_CAPSULE | Freq: Two times a day (BID) | ORAL | 0 refills | Status: DC | PRN
  Filled 2021-09-07: qty 20, 10d supply, fill #0

## 2021-09-07 MED ORDER — AZELASTINE HCL 0.1 % NA SOLN
1.0000 | Freq: Two times a day (BID) | NASAL | 12 refills | Status: DC
  Filled 2021-09-07: qty 30, 30d supply, fill #0

## 2021-09-07 NOTE — Progress Notes (Signed)
Telemedicine Encounter- SOAP NOTE Established Patient  This video encounter was conducted with the patient's (or proxy's) verbal consent via audio telecommunications: yes/no: Yes Patient was instructed to have this encounter in a suitably private space; and to only have persons present to whom they give permission to participate. In addition, patient identity was confirmed by use of name plus two identifiers (DOB and address).  I discussed the limitations, risks, security and privacy concerns of performing an evaluation and management service by telephone and the availability of in person appointments. I also discussed with the patient that there may be a patient responsible charge related to this service. The patient expressed understanding and agreed to proceed.  I spent a total of 14 minutes talking with the patient or their proxy.  Patient at home Provider in office  Participants: Jari Sportsman, NP and Maximino Sarin  Chief Complaint  Patient presents with   Nasal Congestion    eye pressure, sneezing and sinus drainage     Subjective   Monique Turner is a 25 y.o. established patient. Video visit today for congestion  HPI Onset yesterday with sneezing and nasal congestion. Some pressure around eyes Today has been coughing.  Yellowish drainage from nose.   Took robitussin, tylenol cold and flu, dayquil. Has not helped much. Concern for elevation in BP, no symptoms.  No covid home test.  Sick coworkers - unsure if flu or covid or other.   No shob, doe, chest pain, palpitations, headaches, sensory changes, nvd  Patient Active Problem List   Diagnosis Date Noted   Family history of premature CAD 07/21/2021   Essential hypertension 05/13/2020   Menorrhagia with regular cycle 05/13/2020    Past Medical History:  Diagnosis Date   Asthma     Current Outpatient Medications  Medication Sig Dispense Refill   azelastine (ASTELIN) 0.1 % nasal spray Place 1 spray into both  nostrils 2 (two) times daily. Use in each nostril as directed 30 mL 12   benzonatate (TESSALON) 200 MG capsule Take 1 capsule (200 mg total) by mouth 2 (two) times daily as needed for cough. 20 capsule 0   dextromethorphan-guaiFENesin (MUCINEX DM) 30-600 MG 12hr tablet Take 1 tablet by mouth 2 (two) times daily. 20 tablet 0   amLODipine (NORVASC) 2.5 MG tablet Take 1 tablet (2.5 mg total) by mouth daily. 90 tablet 3   ibuprofen (ADVIL) 600 MG tablet TAKE 1 TABLET BY MOUTH EVERY 8 HOURS AS NEEDED 30 tablet 0   metoprolol succinate (TOPROL-XL) 50 MG 24 hr tablet TAKE 1 TABLET BY MOUTH DAILY WITH OR IMMEDIATELY FOLLOWING A MEAL 90 tablet 0   No current facility-administered medications for this visit.    Allergies  Allergen Reactions   Sulfa Antibiotics Diarrhea    Social History   Socioeconomic History   Marital status: Single    Spouse name: Not on file   Number of children: Not on file   Years of education: Not on file   Highest education level: Not on file  Occupational History   Not on file  Tobacco Use   Smoking status: Never   Smokeless tobacco: Never  Substance and Sexual Activity   Alcohol use: Not Currently   Drug use: Never   Sexual activity: Yes    Birth control/protection: Condom  Other Topics Concern   Not on file  Social History Narrative   Not on file   Social Determinants of Health   Financial Resource Strain: Not on  file  Food Insecurity: Not on file  Transportation Needs: Not on file  Physical Activity: Not on file  Stress: Not on file  Social Connections: Not on file  Intimate Partner Violence: Not on file    ROS Per hpi   Objective   Vitals as reported by the patient: Today's Vitals   09/07/21 1029  BP: 130/70  Pulse: 85  SpO2: 100%    Avana was seen today for nasal congestion.  Diagnoses and all orders for this visit:  Nasal congestion -     dextromethorphan-guaiFENesin (MUCINEX DM) 30-600 MG 12hr tablet; Take 1 tablet by mouth 2  (two) times daily. -     azelastine (ASTELIN) 0.1 % nasal spray; Place 1 spray into both nostrils 2 (two) times daily. Use in each nostril as directed  Acute cough -     dextromethorphan-guaiFENesin (MUCINEX DM) 30-600 MG 12hr tablet; Take 1 tablet by mouth 2 (two) times daily. -     benzonatate (TESSALON) 200 MG capsule; Take 1 capsule (200 mg total) by mouth 2 (two) times daily as needed for cough.   PLAN Suspect viral infection. Recommend home covid test when possible. Supportive care with mucinex, tessalon, and azelastine. Return if worsening or failing to improve. She will contact office if positive for COVID. Nonpharm care discussed. Pt voices understanding Patient encouraged to call clinic with any questions, comments, or concerns.  I discussed the assessment and treatment plan with the patient. The patient was provided an opportunity to ask questions and all were answered. The patient agreed with the plan and demonstrated an understanding of the instructions.   The patient was advised to call back or seek an in-person evaluation if the symptoms worsen or if the condition fails to improve as anticipated.  I provided 14 minutes of face-to-face time during this encounter.  Janeece Agee, NP

## 2021-09-11 IMAGING — DX DG CHEST 1V PORT
1 series · 1 of 1 positions shown · non-contrast
Comparison: 03/06/2016

CLINICAL DATA: Nausea, cough

EXAM:
PORTABLE CHEST 1 VIEW

[chest ap]
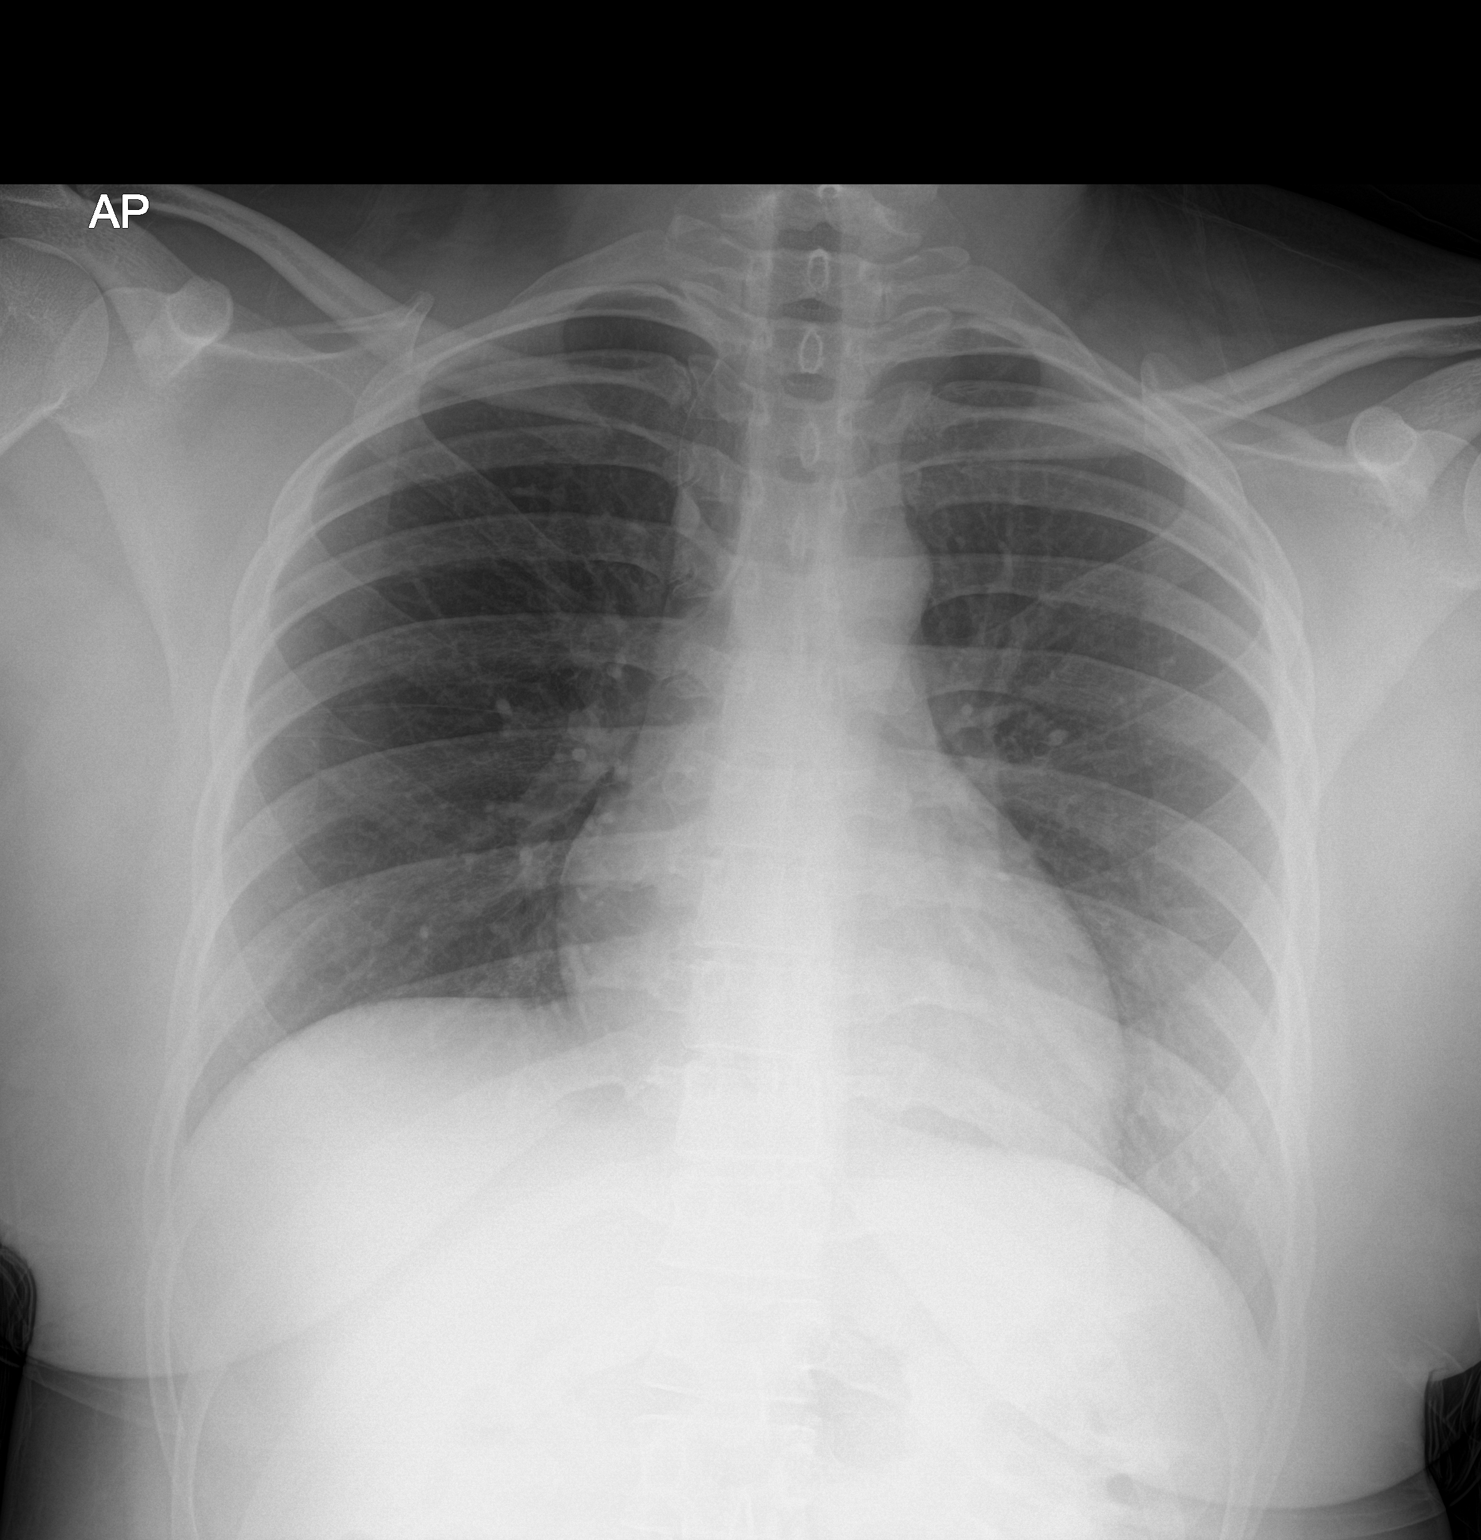

[1 of 1 positions shown; findings below may reference images not displayed]

FINDINGS: The heart size and mediastinal contours are within normal limits.
Both lungs are clear. The visualized skeletal structures are
unremarkable.
IMPRESSION: No active disease.

## 2021-09-19 ENCOUNTER — Other Ambulatory Visit (HOSPITAL_BASED_OUTPATIENT_CLINIC_OR_DEPARTMENT_OTHER): Payer: Self-pay

## 2021-09-23 ENCOUNTER — Other Ambulatory Visit (HOSPITAL_BASED_OUTPATIENT_CLINIC_OR_DEPARTMENT_OTHER): Payer: Self-pay

## 2021-10-20 ENCOUNTER — Other Ambulatory Visit (HOSPITAL_BASED_OUTPATIENT_CLINIC_OR_DEPARTMENT_OTHER): Payer: Self-pay

## 2021-10-20 ENCOUNTER — Other Ambulatory Visit: Payer: Self-pay

## 2021-10-20 DIAGNOSIS — I10 Essential (primary) hypertension: Secondary | ICD-10-CM

## 2021-10-20 MED ORDER — METOPROLOL SUCCINATE ER 50 MG PO TB24
ORAL_TABLET | ORAL | 0 refills | Status: DC
  Filled 2021-10-20: qty 90, 30d supply, fill #0

## 2021-10-25 ENCOUNTER — Other Ambulatory Visit (HOSPITAL_BASED_OUTPATIENT_CLINIC_OR_DEPARTMENT_OTHER): Payer: Self-pay

## 2021-10-25 ENCOUNTER — Other Ambulatory Visit: Payer: Self-pay

## 2021-10-25 DIAGNOSIS — I10 Essential (primary) hypertension: Secondary | ICD-10-CM

## 2021-10-25 MED ORDER — AMLODIPINE BESYLATE 2.5 MG PO TABS
2.5000 mg | ORAL_TABLET | Freq: Every day | ORAL | 3 refills | Status: DC
  Filled 2021-10-25: qty 90, 90d supply, fill #0
  Filled 2022-01-18: qty 90, 90d supply, fill #1
  Filled 2022-04-19: qty 90, 90d supply, fill #2
  Filled 2022-07-24: qty 90, 90d supply, fill #3

## 2021-11-19 IMAGING — US US PELVIS COMPLETE WITH TRANSVAGINAL
1 series · 13 of 25 positions shown · non-contrast
Comparison: None available.

CLINICAL DATA: Initial evaluation for heavy periods.



[Series 1: us pelvis complete with transvaginal · 13 of 86 slices shown]
[im 1/86]
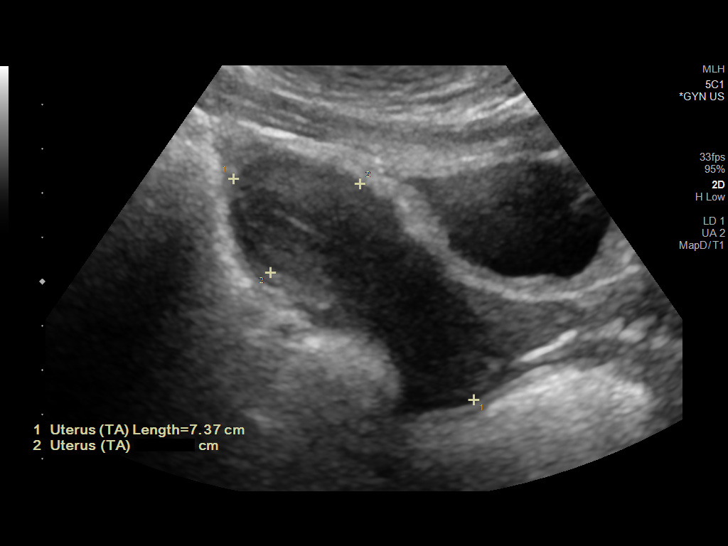
[im 8/86]
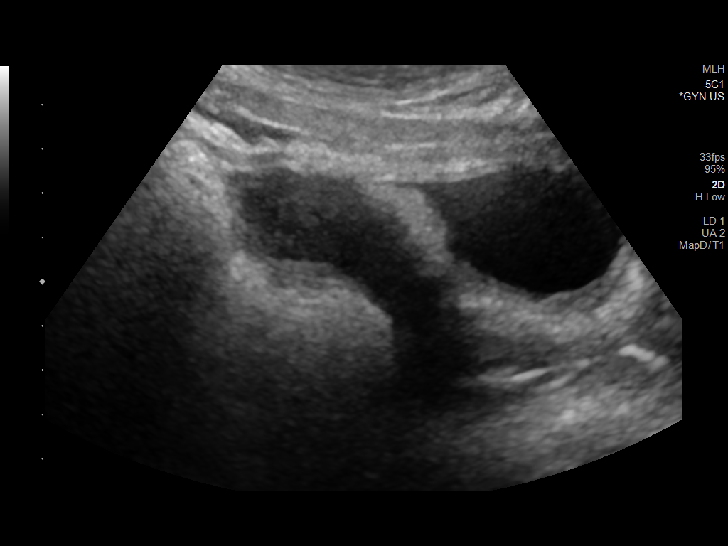
[im 15/86]
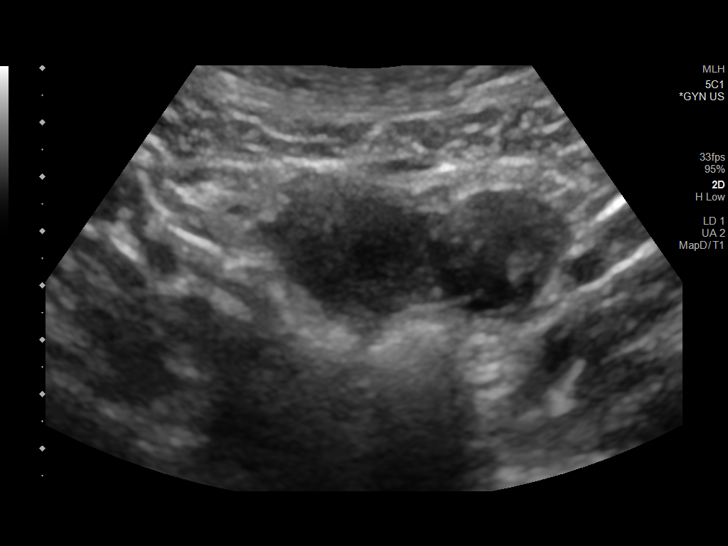
[im 22/86]
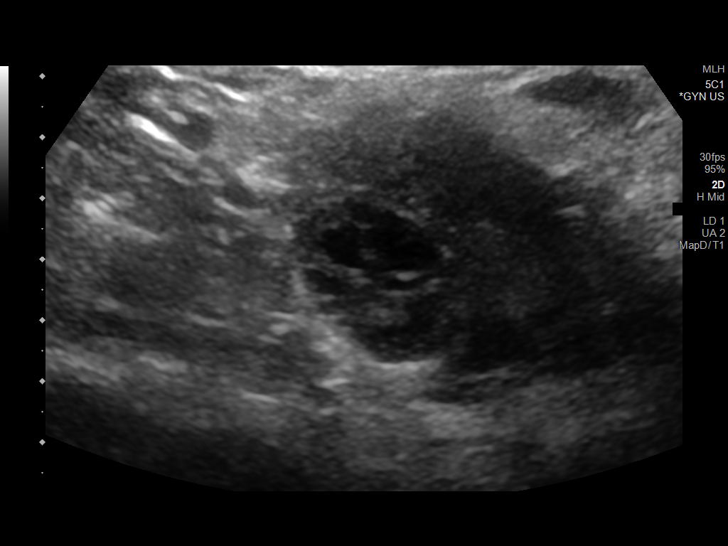
[im 29/86]
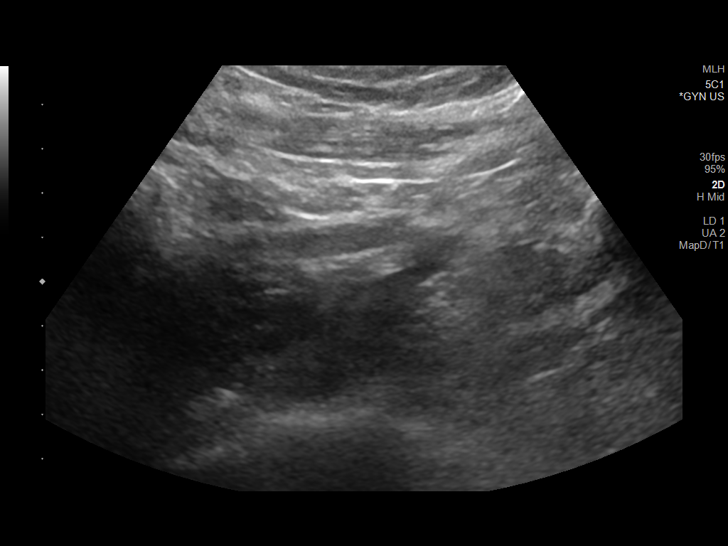
[im 36/86]
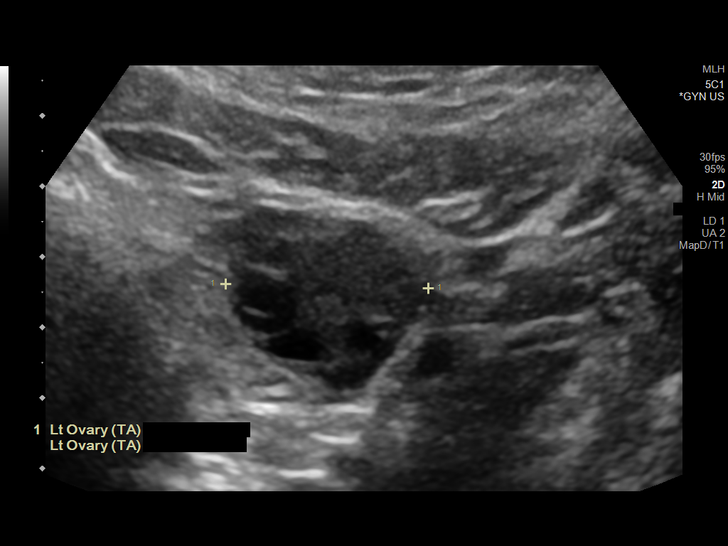
[im 43/86]
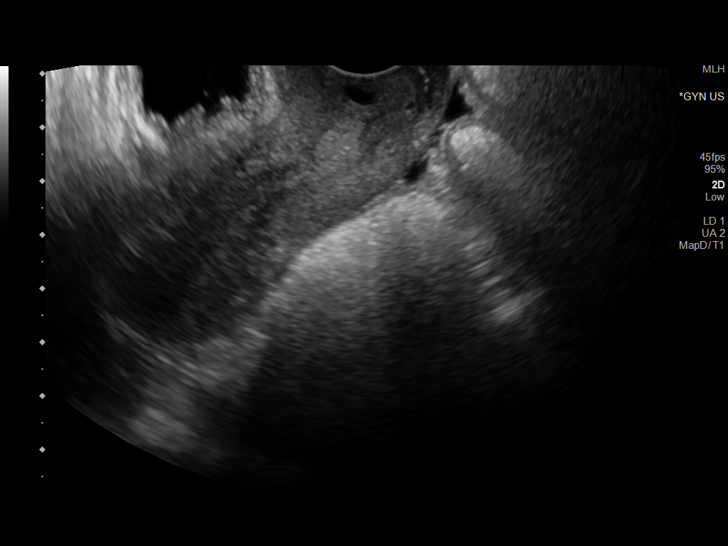
[im 50/86]
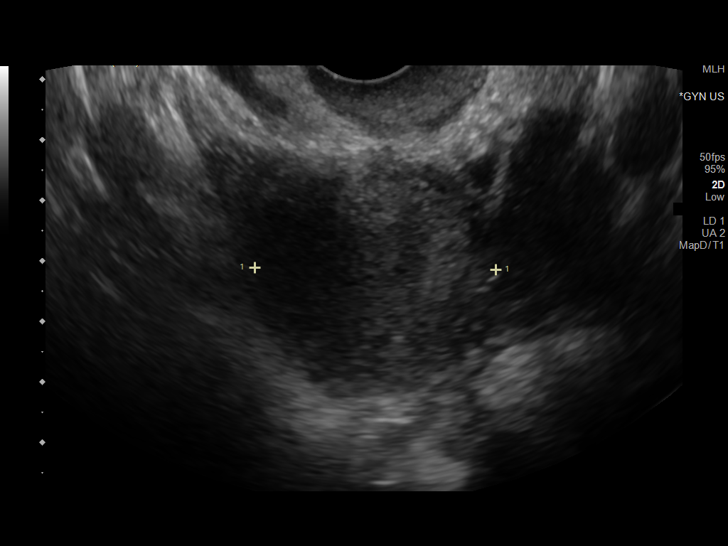
[im 57/86]
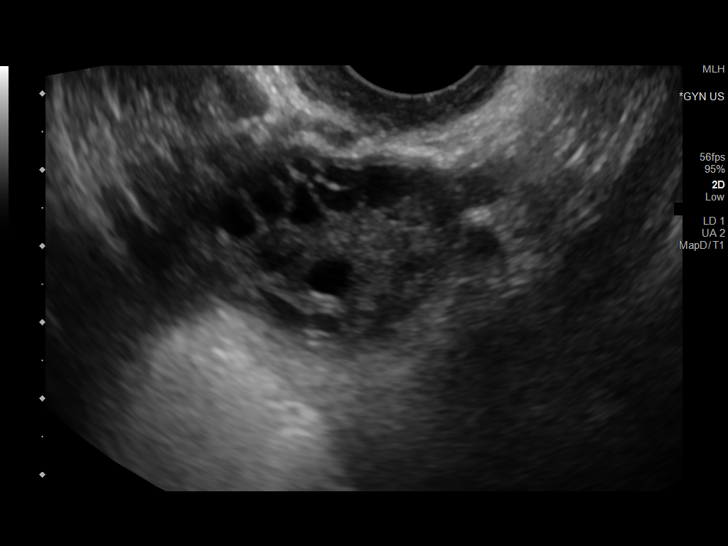
[im 64/86]
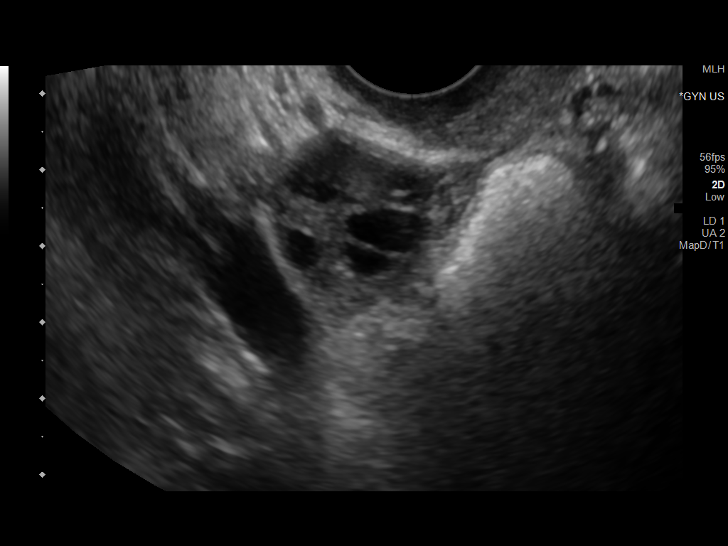
[im 71/86]
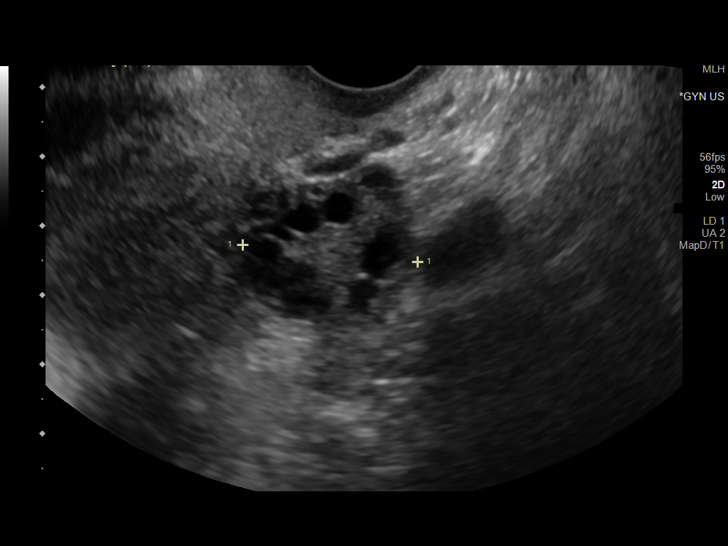
[im 78/86]
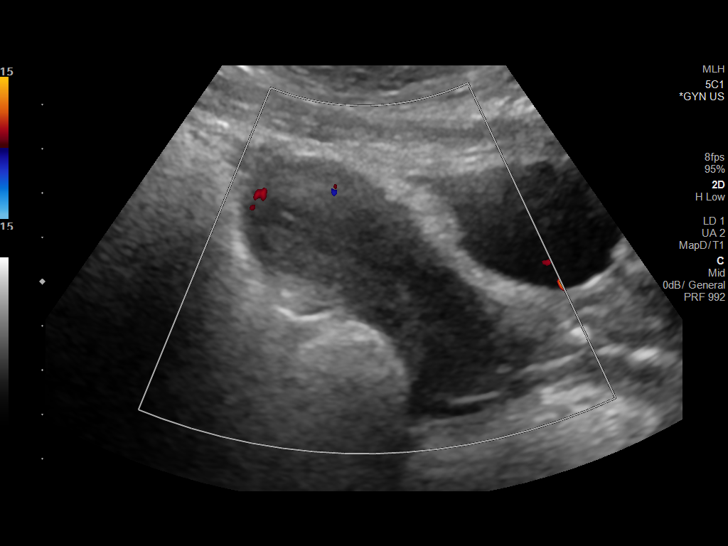
[im 86/86]
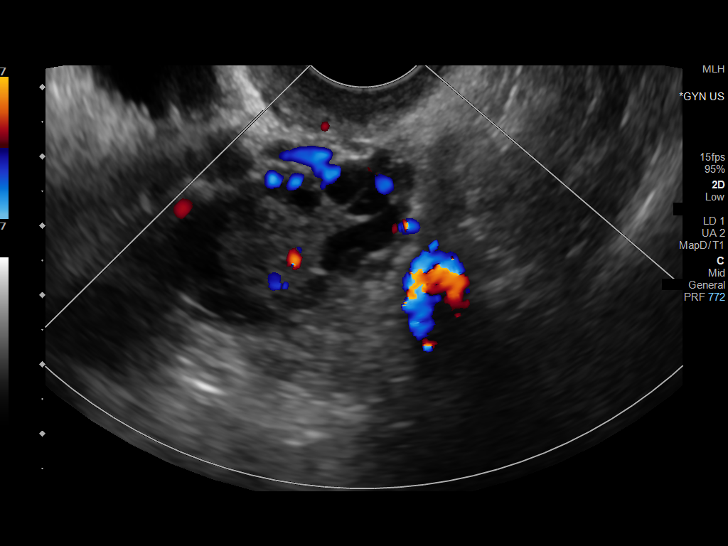

[13 of 25 positions shown; findings below may reference images not displayed]

FINDINGS: Uterus

Measurements: 7.4 x 2.9 x 4.0 cm = volume: 45 mL. No fibroids or
other mass visualized.

Endometrium

Thickness: 4.3 mm.  No focal abnormality visualized.

Right ovary

Measurements: 3.4 x 2.4 x 2.8 cm = volume: 12 mL. Increased number
of follicles seen within the right ovary, several of which are
oriented along the periphery. No adnexal mass.

Left ovary

Measurements: 3.6 x 2.4 x 2.5 cm = volume: 11 mL. Increased number
of follicles seen within the left ovary, several of which are
oriented along the periphery. No adnexal mass.

Other findings

Trace free fluid within the pelvis, presumably physiologic.
IMPRESSION: 1. Endometrial stripe measures 4.3 mm in thickness. If bleeding
remains unresponsive to hormonal or medical therapy, sonohysterogram
should be considered for focal lesion work-up. (Ref: Radiological
Reasoning: Algorithmic Workup of Abnormal Vaginal Bleeding with
Endovaginal Sonography and Sonohysterography. AJR 2552; 191:S68-73).
2. Normal sonographic appearance of the uterus.
3. Increased number of follicles within the ovaries bilaterally,
many of which are oriented along the periphery. Findings are
nonspecific, but can be seen in the setting of PCOS. Clinical
correlation recommended.

## 2021-12-28 ENCOUNTER — Encounter: Payer: Self-pay | Admitting: Nurse Practitioner

## 2021-12-30 ENCOUNTER — Other Ambulatory Visit: Payer: Self-pay

## 2021-12-30 ENCOUNTER — Other Ambulatory Visit (HOSPITAL_BASED_OUTPATIENT_CLINIC_OR_DEPARTMENT_OTHER): Payer: Self-pay

## 2021-12-30 MED ORDER — METRONIDAZOLE 500 MG PO TABS
500.0000 mg | ORAL_TABLET | Freq: Two times a day (BID) | ORAL | 0 refills | Status: AC
  Filled 2021-12-30: qty 14, 7d supply, fill #0

## 2022-01-18 ENCOUNTER — Other Ambulatory Visit (HOSPITAL_BASED_OUTPATIENT_CLINIC_OR_DEPARTMENT_OTHER): Payer: Self-pay

## 2022-01-18 ENCOUNTER — Other Ambulatory Visit (INDEPENDENT_AMBULATORY_CARE_PROVIDER_SITE_OTHER): Payer: 59

## 2022-01-18 ENCOUNTER — Other Ambulatory Visit: Payer: Self-pay | Admitting: Family Medicine

## 2022-01-18 DIAGNOSIS — I10 Essential (primary) hypertension: Secondary | ICD-10-CM

## 2022-01-18 DIAGNOSIS — E7800 Pure hypercholesterolemia, unspecified: Secondary | ICD-10-CM

## 2022-01-18 LAB — LIPID PANEL
Cholesterol: 256 mg/dL — ABNORMAL HIGH (ref 0–200)
HDL: 64.4 mg/dL (ref >39.00)
LDL Cholesterol: 178 mg/dL — ABNORMAL HIGH (ref 0–99)
NonHDL: 191.29
Total CHOL/HDL Ratio: 4
Triglycerides: 67 mg/dL (ref 0.0–149.0)
VLDL: 13.4 mg/dL (ref 0.0–40.0)

## 2022-01-18 LAB — BASIC METABOLIC PANEL
BUN: 8 mg/dL (ref 6–23)
CO2: 27 mEq/L (ref 19–32)
Calcium: 9 mg/dL (ref 8.4–10.5)
Chloride: 103 mEq/L (ref 96–112)
Creatinine, Ser: 0.78 mg/dL (ref 0.40–1.20)
GFR: 104.88 mL/min (ref >60.00)
Glucose, Bld: 73 mg/dL (ref 70–99)
Potassium: 4 mEq/L (ref 3.5–5.1)
Sodium: 138 mEq/L (ref 135–145)

## 2022-01-18 MED ORDER — METOPROLOL SUCCINATE ER 50 MG PO TB24
ORAL_TABLET | ORAL | 0 refills | Status: DC
  Filled 2022-01-18: qty 90, 90d supply, fill #0

## 2022-01-19 ENCOUNTER — Other Ambulatory Visit (HOSPITAL_BASED_OUTPATIENT_CLINIC_OR_DEPARTMENT_OTHER): Payer: Self-pay

## 2022-01-20 ENCOUNTER — Other Ambulatory Visit: Payer: 59

## 2022-01-23 ENCOUNTER — Other Ambulatory Visit (HOSPITAL_BASED_OUTPATIENT_CLINIC_OR_DEPARTMENT_OTHER): Payer: Self-pay

## 2022-01-24 ENCOUNTER — Other Ambulatory Visit: Payer: 59

## 2022-01-25 ENCOUNTER — Encounter: Payer: Self-pay | Admitting: Nurse Practitioner

## 2022-01-25 DIAGNOSIS — Z8249 Family history of ischemic heart disease and other diseases of the circulatory system: Secondary | ICD-10-CM

## 2022-01-25 DIAGNOSIS — E7800 Pure hypercholesterolemia, unspecified: Secondary | ICD-10-CM

## 2022-03-01 ENCOUNTER — Other Ambulatory Visit (HOSPITAL_BASED_OUTPATIENT_CLINIC_OR_DEPARTMENT_OTHER): Payer: Self-pay

## 2022-03-01 ENCOUNTER — Other Ambulatory Visit: Payer: Self-pay | Admitting: Nurse Practitioner

## 2022-03-01 MED ORDER — IBUPROFEN 600 MG PO TABS
600.0000 mg | ORAL_TABLET | Freq: Three times a day (TID) | ORAL | 3 refills | Status: AC | PRN
  Filled 2022-03-01 – 2022-03-30 (×2): qty 30, 10d supply, fill #0
  Filled 2022-07-24: qty 30, 10d supply, fill #1

## 2022-03-03 DIAGNOSIS — J45909 Unspecified asthma, uncomplicated: Secondary | ICD-10-CM | POA: Insufficient documentation

## 2022-03-08 NOTE — Progress Notes (Unsigned)
Cardiology Office Note:    Date:  03/09/2022   ID:  Monique Turner, DOB Jul 03, 1996, MRN [id number]  PCP:  Flossie Buffy, NP  Cardiologist:  Shirlee More, MD   Referring MD: Flossie Buffy, NP  ASSESSMENT:    1. Pure hypercholesterolemia   2. Essential hypertension   3. Family history of premature CAD    PLAN:    In order of problems listed above:  She has hyperlipidemia hypertension family history of premature coronary disease and after discussion of benefits wants to start lipid-lowering therapy I will put her on a low-dose of low intensity pravastatin plan to recheck her lipids in about [redacted] weeks along with an LP(a) strongly encouraged her to continue her beneficial lifestyle changes and at any concern of being pregnant to stop her statin. Stable continue current antihypertensives  Next appointment 3 months   Medication Adjustments/Labs and Tests Ordered: Current medicines are reviewed at length with the patient today.  Concerns regarding medicines are outlined above.  Orders Placed This Encounter  Procedures   Comprehensive Metabolic Panel (CMET)   Lipid Profile   Lipoprotein A (LPA)   EKG 12-Lead   Meds ordered this encounter  Medications   pravastatin (PRAVACHOL) 20 MG tablet    Sig: Take 1 tablet (20 mg total) by mouth daily.    Dispense:  90 tablet    Refill:  3     Chief Complaint  Patient presents with   Hyperlipidemia    History of Present Illness:    Monique Turner is a 26 y.o. female who is being seen today for the evaluation of hyperlipidemia and cardiovascular risk at the request of Nche, Charlene Brooke, NP.  She has a history of hypertension has been on medications for about 2 years She has a history of premature heart disease in her family paternal grandfather died in his 23s paternal uncle had bypass surgery in his 71s. She had a lipid profile was noted to have severe dyslipidemia She changed her diet exercise rechecked and she has had  no beneficial response in fact her LDL cholesterol is increased approximately 10% from 1 62-1 78 Past Medical History:  Diagnosis Date   Asthma    Essential hypertension 05/13/2020   Family history of premature CAD 07/21/2021   PGF and paternal uncle   Menorrhagia with regular cycle 05/13/2020    Past Surgical History:  Procedure Laterality Date   NO PAST SURGERIES      Current Medications: Current Meds  Medication Sig   amLODipine (NORVASC) 2.5 MG tablet Take 1 tablet (2.5 mg total) by mouth daily.   ibuprofen (ADVIL) 600 MG tablet Take 1 tablet (600 mg total) by mouth every 8 (eight) hours as needed.   metoprolol succinate (TOPROL-XL) 50 MG 24 hr tablet TAKE 1 TABLET BY MOUTH DAILY WITH OR IMMEDIATELY FOLLOWING A MEAL   pravastatin (PRAVACHOL) 20 MG tablet Take 1 tablet (20 mg total) by mouth daily.     Allergies:   Sulfa antibiotics   Social History   Socioeconomic History   Marital status: Single    Spouse name: Not on file   Number of children: Not on file   Years of education: Not on file   Highest education level: Not on file  Occupational History   Not on file  Tobacco Use   Smoking status: Never   Smokeless tobacco: Never  Substance and Sexual Activity   Alcohol use: Not Currently   Drug use: Never  Sexual activity: Yes    Birth control/protection: Condom  Other Topics Concern   Not on file  Social History Narrative   Not on file   Social Determinants of Health   Financial Resource Strain: Not on file  Food Insecurity: Not on file  Transportation Needs: Not on file  Physical Activity: Not on file  Stress: Not on file  Social Connections: Not on file     Family History: The patient's family history includes Cancer (age of onset: 18) in her paternal grandmother; Diabetes in her maternal aunt and maternal uncle; Early death in her paternal uncle; Early death (age of onset: 11) in her paternal grandfather; Heart disease in her paternal uncle; Heart  disease (age of onset: 101) in her paternal uncle; Hyperlipidemia in her father; Hypertension in her father, paternal aunt, and paternal uncle; Thyroid disease in her paternal aunt.  ROS:   ROS Please see the history of present illness.     All other systems reviewed and are negative.  EKGs/Labs/Other Studies Reviewed:    The following studies were reviewed today:   EKG:  EKG is  ordered today.  The ekg ordered today is personally reviewed and demonstrates sinus rhythm normal EKG  Recent Labs: 07/21/2021: ALT 12; Hemoglobin 12.2; Platelets 399.0; TSH 0.68 01/18/2022: BUN 8; Creatinine, Ser 0.78; Potassium 4.0; Sodium 138  Recent Lipid Panel    Component Value Date/Time   CHOL 256 (H) 01/18/2022 0704   TRIG 67.0 01/18/2022 0704   HDL 64.40 01/18/2022 0704   CHOLHDL 4 01/18/2022 0704   VLDL 13.4 01/18/2022 0704   LDLCALC 178 (H) 01/18/2022 0704    Physical Exam:    VS:  BP 122/80   Pulse 63   Ht 5\' 1"  (1.549 m)   Wt 196 lb (88.9 kg)   SpO2 99%   BMI 37.03 kg/m     Wt Readings from Last 3 Encounters:  03/09/22 196 lb (88.9 kg)  07/21/21 198 lb 9.6 oz (90.1 kg)  06/30/21 198 lb (89.8 kg)     GEN: She has no xanthoma or xanthelasma well nourished, well developed in no acute distress HEENT: Normal NECK: No JVD; No carotid bruits LYMPHATICS: No lymphadenopathy CARDIAC: RRR, no murmurs, rubs, gallops RESPIRATORY:  Clear to auscultation without rales, wheezing or rhonchi  ABDOMEN: Soft, non-tender, non-distended MUSCULOSKELETAL:  No edema; No deformity  SKIN: Warm and dry NEUROLOGIC:  Alert and oriented x 3 PSYCHIATRIC:  Normal affect     Signed, Shirlee More, MD  03/09/2022 4:21 PM    [facility]

## 2022-03-09 ENCOUNTER — Ambulatory Visit (INDEPENDENT_AMBULATORY_CARE_PROVIDER_SITE_OTHER): Payer: 59 | Admitting: Cardiology

## 2022-03-09 ENCOUNTER — Encounter: Payer: Self-pay | Admitting: Cardiology

## 2022-03-09 ENCOUNTER — Other Ambulatory Visit (HOSPITAL_BASED_OUTPATIENT_CLINIC_OR_DEPARTMENT_OTHER): Payer: Self-pay

## 2022-03-09 VITALS — BP 122/80 | HR 63 | Ht 61.0 in | Wt 196.0 lb

## 2022-03-09 DIAGNOSIS — E7800 Pure hypercholesterolemia, unspecified: Secondary | ICD-10-CM | POA: Diagnosis not present

## 2022-03-09 DIAGNOSIS — I10 Essential (primary) hypertension: Secondary | ICD-10-CM

## 2022-03-09 DIAGNOSIS — Z8249 Family history of ischemic heart disease and other diseases of the circulatory system: Secondary | ICD-10-CM | POA: Diagnosis not present

## 2022-03-09 MED ORDER — PRAVASTATIN SODIUM 20 MG PO TABS
20.0000 mg | ORAL_TABLET | Freq: Every day | ORAL | 3 refills | Status: DC
  Filled 2022-03-09 – 2022-03-30 (×2): qty 90, 90d supply, fill #0
  Filled 2022-07-24: qty 90, 90d supply, fill #1

## 2022-03-09 NOTE — Patient Instructions (Signed)
Medication Instructions:  Your physician has recommended you make the following change in your medication:   START: Pravastatin 20 mg daily  *If you need a refill on your cardiac medications before your next appointment, please call your pharmacy*   Lab Work: Your physician recommends that you return for lab work in:   Labs in 6 weeks in [address]: CMP, Lipids, LPa  If you have labs (blood work) drawn today and your tests are completely normal, you will receive your results only by: MyChart Message (if you have MyChart) OR A paper copy in the mail If you have any lab test that is abnormal or we need to change your treatment, we will call you to review the results.   Testing/Procedures: None   Follow-Up: At [facility], you and your health needs are our priority.  As part of our continuing mission to provide you with exceptional heart care, we have created designated Provider Care Teams.  These Care Teams include your primary Cardiologist (physician) and Advanced Practice Providers (APPs -  Physician Assistants and Nurse Practitioners) who all work together to provide you with the care you need, when you need it.  We recommend signing up for the patient portal called "MyChart".  Sign up information is provided on this After Visit Summary.  MyChart is used to connect with patients for Virtual Visits (Telemedicine).  Patients are able to view lab/test results, encounter notes, upcoming appointments, etc.  Non-urgent messages can be sent to your provider as well.   To learn more about what you can do with MyChart, go to [email or web address].    Your next appointment:   1 year(s)  The format for your next appointment:   In Person  Provider:   Norman Herrlich, MD    Other Instructions None  Important Information About Sugar

## 2022-03-10 ENCOUNTER — Other Ambulatory Visit (HOSPITAL_BASED_OUTPATIENT_CLINIC_OR_DEPARTMENT_OTHER): Payer: Self-pay

## 2022-03-30 ENCOUNTER — Encounter: Payer: Self-pay | Admitting: Nurse Practitioner

## 2022-03-30 ENCOUNTER — Other Ambulatory Visit (HOSPITAL_BASED_OUTPATIENT_CLINIC_OR_DEPARTMENT_OTHER): Payer: Self-pay

## 2022-03-30 ENCOUNTER — Ambulatory Visit (INDEPENDENT_AMBULATORY_CARE_PROVIDER_SITE_OTHER): Payer: 59 | Admitting: Nurse Practitioner

## 2022-03-30 ENCOUNTER — Telehealth: Payer: Self-pay

## 2022-03-30 VITALS — BP 126/80 | HR 75 | Temp 97.8°F | Ht 61.0 in | Wt 193.6 lb

## 2022-03-30 DIAGNOSIS — S8001XA Contusion of right knee, initial encounter: Secondary | ICD-10-CM | POA: Diagnosis not present

## 2022-03-30 NOTE — Telephone Encounter (Signed)
Home Vital Signs:  BP 120/78 Pulse 87  O2 - 98  LMP - 03/28/22
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
## 2022-03-30 NOTE — Patient Instructions (Signed)
Use ibuprofen 600mg  TID till Sundaym, then stop. Take with food. Apply cold compress 2-3x/day, 07-30-1988 each time. Call office if no improvement in 1-2weeks.  Contusion A contusion is a deep bruise. This is a result of an injury that causes bleeding under the skin. Symptoms of bruising include pain, swelling, and discolored skin. The skin may turn blue, purple, or yellow. Follow these instructions at home: Managing pain, stiffness, and swelling You may use RICE. This stands for: Resting. Icing. Compression, or putting pressure. Elevating, or raising the injured area. To follow this method, do these actions: Rest the injured area. If told, put ice on the injured area. Put ice in a plastic bag. Place a towel between your skin and the bag. Leave the ice on for 20 minutes, 2-3 times per day. If told, put light pressure (compression) on the injured area using an elastic bandage. Make sure the bandage is not too tight. If the area tingles or becomes numb, remove it and put it back on as told by your doctor. If possible, raise (elevate) the injured area above the level of your heart while you are sitting or lying down.  General instructions Take over-the-counter and prescription medicines only as told by your doctor. Keep all follow-up visits as told by your doctor. This is important. Contact a doctor if: Your symptoms do not get better after several days of treatment. Your symptoms get worse. You have trouble moving the injured area. Get help right away if: You have very bad pain. You have a loss of feeling (numbness) in a hand or foot. Your hand or foot turns pale or cold. Summary A contusion is a deep bruise. This is a result of an injury that causes bleeding under the skin. Symptoms of bruising include pain, swelling, and discolored skin. The skin may turn blue, purple, or yellow. This condition is treated with rest, ice, compression, and elevation. This is also called RICE. You may be  given over-the-counter medicines for pain. Contact a doctor if you do not feel better, or you feel worse. Get help right away if you have very bad pain, have lost feeling in a hand or foot, or the area turns pale or cold. This information is not intended to replace advice given to you by your health care provider. Make sure you discuss any questions you have with your health care provider. Document Revised: 07/21/2021 Document Reviewed: 07/21/2021 Elsevier Patient Education  2023 2024.

## 2022-03-30 NOTE — Telephone Encounter (Signed)
Called & left pt a VM. She is to make an appt to be seen in office for Knee Pain.

## 2022-04-19 ENCOUNTER — Other Ambulatory Visit (HOSPITAL_BASED_OUTPATIENT_CLINIC_OR_DEPARTMENT_OTHER): Payer: Self-pay

## 2022-04-19 ENCOUNTER — Other Ambulatory Visit: Payer: Self-pay | Admitting: Family Medicine

## 2022-04-19 DIAGNOSIS — I10 Essential (primary) hypertension: Secondary | ICD-10-CM

## 2022-04-19 MED ORDER — METOPROLOL SUCCINATE ER 50 MG PO TB24
ORAL_TABLET | ORAL | 1 refills | Status: DC
  Filled 2022-04-19: qty 90, 90d supply, fill #0
  Filled 2022-07-24: qty 90, 90d supply, fill #1

## 2022-07-24 ENCOUNTER — Other Ambulatory Visit (HOSPITAL_BASED_OUTPATIENT_CLINIC_OR_DEPARTMENT_OTHER): Payer: Self-pay

## 2022-10-27 ENCOUNTER — Other Ambulatory Visit (HOSPITAL_BASED_OUTPATIENT_CLINIC_OR_DEPARTMENT_OTHER): Payer: Self-pay

## 2022-10-27 ENCOUNTER — Other Ambulatory Visit: Payer: Self-pay

## 2022-10-27 DIAGNOSIS — I10 Essential (primary) hypertension: Secondary | ICD-10-CM

## 2022-10-27 MED ORDER — METOPROLOL SUCCINATE ER 50 MG PO TB24
50.0000 mg | ORAL_TABLET | Freq: Every day | ORAL | 1 refills | Status: DC
  Filled 2022-10-27: qty 90, 90d supply, fill #0
  Filled 2023-02-02: qty 90, 90d supply, fill #1

## 2022-11-14 ENCOUNTER — Encounter: Payer: Self-pay | Admitting: Family Medicine

## 2022-11-14 ENCOUNTER — Other Ambulatory Visit (HOSPITAL_BASED_OUTPATIENT_CLINIC_OR_DEPARTMENT_OTHER): Payer: Self-pay

## 2022-11-14 ENCOUNTER — Ambulatory Visit (INDEPENDENT_AMBULATORY_CARE_PROVIDER_SITE_OTHER): Payer: Commercial Managed Care - PPO | Admitting: Family Medicine

## 2022-11-14 VITALS — BP 130/86 | HR 79 | Temp 98.8°F | Ht 61.0 in | Wt 190.0 lb

## 2022-11-14 DIAGNOSIS — R059 Cough, unspecified: Secondary | ICD-10-CM | POA: Diagnosis not present

## 2022-11-14 LAB — POC COVID19 BINAXNOW: SARS Coronavirus 2 Ag: NEGATIVE

## 2022-11-14 MED ORDER — HYDROCOD POLI-CHLORPHE POLI ER 10-8 MG/5ML PO SUER
5.0000 mL | Freq: Two times a day (BID) | ORAL | 0 refills | Status: DC | PRN
  Filled 2022-11-14: qty 115, 12d supply, fill #0

## 2022-11-14 NOTE — Patient Instructions (Signed)
Continue to push fluids, practice good hand hygiene, and cover your mouth if you cough. ° °If you start having fevers, shaking or shortness of breath, seek immediate care. ° °OK to take Tylenol 1000 mg (2 extra strength tabs) or 975 mg (3 regular strength tabs) every 6 hours as needed. ° °Do not drink alcohol, do any illicit/street drugs, drive or do anything that requires alertness while on this medicine.  ° °Let us know if you need anything. °

## 2022-11-14 NOTE — Progress Notes (Signed)
Chief Complaint  Patient presents with   Cough    Monique Turner here for URI complaints.  Duration: 4 days  Associated symptoms:  cough Denies: sinus congestion, sinus pain, rhinorrhea, itchy watery eyes, ear fullness, ear pain, ear drainage, sore throat, wheezing, shortness of breath, myalgia, and fevers Treatment to date: Nyquil, Tessalon Perles Sick contacts: No Tested neg for covid.   Past Medical History:  Diagnosis Date   Asthma    Essential hypertension 05/13/2020   Family history of premature CAD 07/21/2021   PGF and paternal uncle   Menorrhagia with regular cycle 05/13/2020    Objective BP 130/86 (BP Location: Left Arm, Cuff Size: Normal)   Pulse 79   Temp 98.8 F (37.1 C) (Oral)   Ht 5\' 1"  (1.549 m)   Wt 190 lb (86.2 kg)   SpO2 98%   BMI 35.90 kg/m  General: Awake, alert, appears stated age HEENT: AT, [place], ears patent b/l and TM's neg, nares patent w/o discharge, pharynx pink and without exudates, MMM Neck: No masses or asymmetry Heart: RRR Lungs: CTAB, no accessory muscle use Psych: Age appropriate judgment and insight, normal mood and affect  Cough, unspecified type - Plan: POC COVID-19, chlorpheniramine-HYDROcodone (TUSSIONEX) 10-8 MG/5ML  Tested neg for covid. Continue to push fluids, practice good hand hygiene, cover mouth when coughing. Warnings about syrup verbalized and written down.  F/u prn. If starting to experience fevers, shaking, or shortness of breath, seek immediate care. Pt voiced understanding and agreement to the plan.  [place], DO 11/14/22 12:12 PM

## 2022-11-15 ENCOUNTER — Telehealth: Payer: Self-pay

## 2022-11-15 ENCOUNTER — Other Ambulatory Visit: Payer: Self-pay | Admitting: Family Medicine

## 2022-11-15 ENCOUNTER — Ambulatory Visit (INDEPENDENT_AMBULATORY_CARE_PROVIDER_SITE_OTHER): Payer: Commercial Managed Care - PPO

## 2022-11-15 ENCOUNTER — Other Ambulatory Visit (HOSPITAL_BASED_OUTPATIENT_CLINIC_OR_DEPARTMENT_OTHER): Payer: Self-pay

## 2022-11-15 DIAGNOSIS — R059 Cough, unspecified: Secondary | ICD-10-CM

## 2022-11-15 MED ORDER — AZITHROMYCIN 250 MG PO TABS
ORAL_TABLET | ORAL | 0 refills | Status: DC
  Filled 2022-11-15: qty 6, 5d supply, fill #0

## 2022-11-15 MED ORDER — METHYLPREDNISOLONE ACETATE 80 MG/ML IJ SUSP
80.0000 mg | Freq: Once | INTRAMUSCULAR | Status: AC
  Administered 2022-11-15: 80 mg via INTRAMUSCULAR

## 2022-11-15 NOTE — Telephone Encounter (Addendum)
Noted. Pt agreed to shot. I will place her on the nurse schedule. FYI

## 2022-11-15 NOTE — Progress Notes (Signed)
Pt seen by [facility] yesterday. Pt advised provider that the medication didn't help. See phone note. Depo Medrol 80 was given in the right hip. Pt handled well.

## 2022-11-15 NOTE — Telephone Encounter (Signed)
Pt states the cough medicine made her cough more. The cough is now dry. Pt was unable to sleep last night due to cough. Pt had to take Theraflu along with medication to stop coughing and to be able to sleep.

## 2022-12-03 ENCOUNTER — Other Ambulatory Visit: Payer: Self-pay

## 2022-12-03 ENCOUNTER — Emergency Department (HOSPITAL_BASED_OUTPATIENT_CLINIC_OR_DEPARTMENT_OTHER)
Admission: EM | Admit: 2022-12-03 | Discharge: 2022-12-03 | Disposition: A | Discharge status: Home / Self Care | Payer: Commercial Managed Care - PPO | Attending: Emergency Medicine | Admitting: Emergency Medicine

## 2022-12-03 DIAGNOSIS — R112 Nausea with vomiting, unspecified: Secondary | ICD-10-CM | POA: Insufficient documentation

## 2022-12-03 DIAGNOSIS — Z79899 Other long term (current) drug therapy: Secondary | ICD-10-CM | POA: Diagnosis not present

## 2022-12-03 DIAGNOSIS — D72829 Elevated white blood cell count, unspecified: Secondary | ICD-10-CM | POA: Insufficient documentation

## 2022-12-03 DIAGNOSIS — R109 Unspecified abdominal pain: Secondary | ICD-10-CM | POA: Diagnosis not present

## 2022-12-03 DIAGNOSIS — E860 Dehydration: Secondary | ICD-10-CM | POA: Insufficient documentation

## 2022-12-03 LAB — CBC WITH DIFFERENTIAL/PLATELET
Abs Immature Granulocytes: 0.04 10*3/uL (ref 0.00–0.07)
Basophils Absolute: 0.1 10*3/uL (ref 0.0–0.1)
Basophils Relative: 1 %
Eosinophils Absolute: 0 10*3/uL (ref 0.0–0.5)
Eosinophils Relative: 0 %
HCT: 36.6 % (ref 36.0–46.0)
Hemoglobin: 12.3 g/dL (ref 12.0–15.0)
Immature Granulocytes: 0 %
Lymphocytes Relative: 10 %
Lymphs Abs: 1.4 10*3/uL (ref 0.7–4.0)
MCH: 27.2 pg (ref 26.0–34.0)
MCHC: 33.6 g/dL (ref 30.0–36.0)
MCV: 80.8 fL (ref 80.0–100.0)
Monocytes Absolute: 0.6 10*3/uL (ref 0.1–1.0)
Monocytes Relative: 4 %
Neutro Abs: 12 10*3/uL — ABNORMAL HIGH (ref 1.7–7.7)
Neutrophils Relative %: 85 %
Platelets: 401 10*3/uL — ABNORMAL HIGH (ref 150–400)
RBC: 4.53 MIL/uL (ref 3.87–5.11)
RDW: 13.7 % (ref 11.5–15.5)
WBC: 14.1 10*3/uL — ABNORMAL HIGH (ref 4.0–10.5)
nRBC: 0 % (ref 0.0–0.2)

## 2022-12-03 LAB — COMPREHENSIVE METABOLIC PANEL
ALT: 22 U/L (ref 0–44)
AST: 33 U/L (ref 15–41)
Albumin: 4.4 g/dL (ref 3.5–5.0)
Alkaline Phosphatase: 61 U/L (ref 38–126)
Anion gap: 16 — ABNORMAL HIGH (ref 5–15)
BUN: 12 mg/dL (ref 6–20)
CO2: 18 mmol/L — ABNORMAL LOW (ref 22–32)
Calcium: 9.6 mg/dL (ref 8.9–10.3)
Chloride: 106 mmol/L (ref 98–111)
Creatinine, Ser: 0.76 mg/dL (ref 0.44–1.00)
GFR, Estimated: >60 mL/min (ref >60)
Glucose, Bld: 110 mg/dL — ABNORMAL HIGH (ref 70–99)
Potassium: 3.6 mmol/L (ref 3.5–5.1)
Sodium: 140 mmol/L (ref 135–145)
Total Bilirubin: 1 mg/dL (ref 0.3–1.2)
Total Protein: 8.2 g/dL — ABNORMAL HIGH (ref 6.5–8.1)

## 2022-12-03 LAB — URINALYSIS, ROUTINE W REFLEX MICROSCOPIC
Glucose, UA: NEGATIVE mg/dL
Ketones, ur: >=80 mg/dL — AB
Leukocytes,Ua: NEGATIVE
Nitrite: NEGATIVE
Protein, ur: 100 mg/dL — AB
Specific Gravity, Urine: >=1.03 (ref 1.005–1.030)
pH: 5.5 (ref 5.0–8.0)

## 2022-12-03 LAB — URINALYSIS, MICROSCOPIC (REFLEX)

## 2022-12-03 LAB — PREGNANCY, URINE: Preg Test, Ur: NEGATIVE

## 2022-12-03 LAB — LIPASE, BLOOD: Lipase: 20 U/L (ref 11–51)

## 2022-12-03 MED ORDER — ONDANSETRON HCL 4 MG PO TABS
4.0000 mg | ORAL_TABLET | Freq: Four times a day (QID) | ORAL | 0 refills | Status: DC
  Filled 2022-12-03: qty 12, 3d supply, fill #0

## 2022-12-03 MED ORDER — ONDANSETRON 4 MG PO TBDP
4.0000 mg | ORAL_TABLET | Freq: Once | ORAL | Status: AC
  Administered 2022-12-03: 4 mg via ORAL

## 2022-12-03 MED ORDER — SODIUM CHLORIDE 0.9 % IV BOLUS
1000.0000 mL | Freq: Once | INTRAVENOUS | Status: AC
  Administered 2022-12-03: 1000 mL via INTRAVENOUS

## 2022-12-03 MED ORDER — ONDANSETRON 4 MG PO TBDP
ORAL_TABLET | ORAL | Status: AC
  Filled 2022-12-03: qty 1

## 2022-12-03 MED ORDER — ONDANSETRON HCL 4 MG/2ML IJ SOLN
4.0000 mg | Freq: Once | INTRAMUSCULAR | Status: AC
  Administered 2022-12-03: 4 mg via INTRAVENOUS
  Filled 2022-12-03: qty 2

## 2022-12-03 NOTE — ED Provider Notes (Incomplete)
[place] [facility] Provider Note   CSN: [id number] Arrival date & time: 12/03/22  1946     History {Add pertinent medical, surgical, social history, OB history to HPI:1} Chief Complaint  Patient presents with  . Emesis    Monique Turner is a 27 y.o. female with overall noncontributory past medical history presents with nausea, vomiting, abdominal pain after heavy drinking last night.  Patient reports that she had 3 shots, vomited, and then continue to drink throughout the night.  She reports that she was also taking some BC powder.  She reports no history of previous gastric ulcers, GI bleed, she denies any dark, tarry stool, she denies any fever, chills.  She reports that she is continuing to feel nauseous after receiving Zofran in triage but does think that she would be able to tolerate some water.   Emesis      Home Medications Prior to Admission medications   Medication Sig Start Date End Date Taking? Authorizing Provider  amLODipine (NORVASC) 2.5 MG tablet Take 1 tablet (2.5 mg total) by mouth daily. 10/25/21   Nche, Charlene Brooke, NP  azithromycin (ZITHROMAX) 250 MG tablet Take 2 tabs the first day and then 1 tab daily until you run out. 11/15/22   Shelda Pal, DO  chlorpheniramine-HYDROcodone (TUSSIONEX) 10-8 MG/5ML Take 5 mLs by mouth every 12 (twelve) hours as needed for cough. 11/14/22   Shelda Pal, DO  ibuprofen (ADVIL) 600 MG tablet Take 1 tablet (600 mg total) by mouth every 8 (eight) hours as needed. 03/01/22 03/01/23  Shelda Pal, DO  metoprolol succinate (TOPROL-XL) 50 MG 24 hr tablet TAKE 1 TABLET BY MOUTH DAILY WITH OR IMMEDIATELY FOLLOWING A MEAL 10/27/22 10/27/23  Wendling, Crosby Oyster, DO  pravastatin (PRAVACHOL) 20 MG tablet Take 1 tablet (20 mg total) by mouth daily. 03/09/22   Richardo Priest, MD      Allergies    Sulfa antibiotics    Review of Systems   Review of Systems   Gastrointestinal:  Positive for vomiting.  All other systems reviewed and are negative.   Physical Exam Updated Vital Signs BP (!) 143/97 (BP Location: Left Arm)   Pulse 93   Temp 98.4 F (36.9 C) (Oral)   Resp 16   Wt 86.2 kg   SpO2 100%   BMI 35.90 kg/m  Physical Exam Vitals and nursing note reviewed.  Constitutional:      General: She is not in acute distress.    Appearance: Normal appearance.  HENT:     Head: Normocephalic and atraumatic.     Mouth/Throat:     Mouth: Mucous membranes are dry.  Eyes:     General:        Right eye: No discharge.        Left eye: No discharge.  Cardiovascular:     Rate and Rhythm: Normal rate and regular rhythm.     Heart sounds: No murmur heard.    No friction rub. No gallop.  Pulmonary:     Effort: Pulmonary effort is normal.     Breath sounds: Normal breath sounds.  Abdominal:     General: Bowel sounds are normal.     Palpations: Abdomen is soft.     Comments: Mild tenderness palpation epigastric region, no rebound, rigidity, guarding  Skin:    General: Skin is warm and dry.     Capillary Refill: Capillary refill takes less than 2 seconds.  Neurological:  Mental Status: She is alert and oriented to person, place, and time.  Psychiatric:        Mood and Affect: Mood normal.        Behavior: Behavior normal.     ED Results / Procedures / Treatments   Labs (all labs ordered are listed, but only abnormal results are displayed) Labs Reviewed  CBC WITH DIFFERENTIAL/PLATELET - Abnormal; Notable for the following components:      Result Value   WBC 14.1 (*)    Platelets 401 (*)    Neutro Abs 12.0 (*)    All other components within normal limits  COMPREHENSIVE METABOLIC PANEL - Abnormal; Notable for the following components:   CO2 18 (*)    Glucose, Bld 110 (*)    Total Protein 8.2 (*)    Anion gap 16 (*)    All other components within normal limits  LIPASE, BLOOD  URINALYSIS, ROUTINE W REFLEX MICROSCOPIC   PREGNANCY, URINE    EKG None  Radiology No results found.  Procedures Procedures  {Document cardiac monitor, telemetry assessment procedure when appropriate:1}  Medications Ordered in ED Medications  sodium chloride 0.9 % bolus 1,000 mL (has no administration in time range)  ondansetron (ZOFRAN) injection 4 mg (has no administration in time range)  ondansetron (ZOFRAN-ODT) disintegrating tablet 4 mg (4 mg Oral Given 12/03/22 2004)    ED Course/ Medical Decision Making/ A&P   {   Click here for ABCD2, HEART and other calculatorsREFRESH Note before signing :1}                          Medical Decision Making Amount and/or Complexity of Data Reviewed Labs: ordered.  Risk Prescription drug management.   This patient is a 27 y.o. female  who presents to the ED for concern of nausea, vomiting.   Differential diagnoses prior to evaluation: The emergent differential diagnosis includes, but is not limited to, gastritis, gastroenteritis, gastric ulcer, alcoholic gastritis, nausea, vomiting related to hangover, other intra-abdominal abnormality, UTI, upper respiratory infection, versus other. This is not an exhaustive differential.   Past Medical History / Co-morbidities: Overall noncontributory, previously with history of hypertension, asthma  Physical Exam: Physical exam performed. The pertinent findings include: Patient with very mild tenderness palpation of the abdomen, and otherwise stable vital signs, no significant tachycardia, fever, she is mildly hypertensive on arrival blood pressure 143/97 which resolved after treatment.  Respiratory rate unremarkable, stable oxygen saturation on room air.  Lab Tests/Imaging studies: I personally interpreted labs/imaging and the pertinent results include:  ***. ***I agree with the radiologist interpretation.  Cardiac monitoring: EKG obtained and interpreted by my attending physician which shows: ***   Medications: I ordered  medication including ***.  I have reviewed the patients home medicines and have made adjustments as needed.   Disposition: After consideration of the diagnostic results and the patients response to treatment, I feel that *** .   ***emergency department workup does not suggest an emergent condition requiring admission or immediate intervention beyond what has been performed at this time. The plan is: ***. The patient is safe for discharge and has been instructed to return immediately for worsening symptoms, change in symptoms or any other concerns.  Final Clinical Impression(s) / ED Diagnoses Final diagnoses:  None    Rx / DC Orders ED Discharge Orders     None

## 2022-12-03 NOTE — ED Triage Notes (Signed)
Pt arrives with c/o n/v that started this morning. Pt denies ABD pain or fevers. Per pt, she did drink alcohol last night.

## 2022-12-03 NOTE — ED Provider Notes (Signed)
[place] [facility] Provider Note   CSN: [id number] Arrival date & time: 12/03/22  1946     History {Add pertinent medical, surgical, social history, OB history to HPI:1} Chief Complaint  Patient presents with   Emesis    LACIA Turner is a 27 y.o. female.   Emesis      Home Medications Prior to Admission medications   Medication Sig Start Date End Date Taking? Authorizing Provider  amLODipine (NORVASC) 2.5 MG tablet Take 1 tablet (2.5 mg total) by mouth daily. 10/25/21   Nche, Charlene Brooke, NP  azithromycin (ZITHROMAX) 250 MG tablet Take 2 tabs the first day and then 1 tab daily until you run out. 11/15/22   Shelda Pal, DO  chlorpheniramine-HYDROcodone (TUSSIONEX) 10-8 MG/5ML Take 5 mLs by mouth every 12 (twelve) hours as needed for cough. 11/14/22   Shelda Pal, DO  ibuprofen (ADVIL) 600 MG tablet Take 1 tablet (600 mg total) by mouth every 8 (eight) hours as needed. 03/01/22 03/01/23  Shelda Pal, DO  metoprolol succinate (TOPROL-XL) 50 MG 24 hr tablet TAKE 1 TABLET BY MOUTH DAILY WITH OR IMMEDIATELY FOLLOWING A MEAL 10/27/22 10/27/23  Wendling, Crosby Oyster, DO  pravastatin (PRAVACHOL) 20 MG tablet Take 1 tablet (20 mg total) by mouth daily. 03/09/22   Richardo Priest, MD      Allergies    Sulfa antibiotics    Review of Systems   Review of Systems  Gastrointestinal:  Positive for vomiting.    Physical Exam Updated Vital Signs BP (!) 143/97 (BP Location: Left Arm)   Pulse 93   Temp 98.4 F (36.9 C) (Oral)   Resp 16   Wt 86.2 kg   SpO2 100%   BMI 35.90 kg/m  Physical Exam  ED Results / Procedures / Treatments   Labs (all labs ordered are listed, but only abnormal results are displayed) Labs Reviewed  CBC WITH DIFFERENTIAL/PLATELET - Abnormal; Notable for the following components:      Result Value   WBC 14.1 (*)    Platelets 401 (*)    Neutro Abs 12.0 (*)    All other components  within normal limits  COMPREHENSIVE METABOLIC PANEL - Abnormal; Notable for the following components:   CO2 18 (*)    Glucose, Bld 110 (*)    Total Protein 8.2 (*)    Anion gap 16 (*)    All other components within normal limits  LIPASE, BLOOD  URINALYSIS, ROUTINE W REFLEX MICROSCOPIC  PREGNANCY, URINE    EKG None  Radiology No results found.  Procedures Procedures  {Document cardiac monitor, telemetry assessment procedure when appropriate:1}  Medications Ordered in ED Medications  sodium chloride 0.9 % bolus 1,000 mL (has no administration in time range)  ondansetron (ZOFRAN) injection 4 mg (has no administration in time range)  ondansetron (ZOFRAN-ODT) disintegrating tablet 4 mg (4 mg Oral Given 12/03/22 2004)    ED Course/ Medical Decision Making/ A&P   {   Click here for ABCD2, HEART and other calculatorsREFRESH Note before signing :1}                          Medical Decision Making Amount and/or Complexity of Data Reviewed Labs: ordered.  Risk Prescription drug management.   ***  {Document critical care time when appropriate:1} {Document review of labs and clinical decision tools ie heart score, Chads2Vasc2 etc:1}  {Document your independent review of radiology  images, and any outside records:1} {Document your discussion with family members, caretakers, and with consultants:1} {Document social determinants of health affecting pt's care:1} {Document your decision making why or why not admission, treatments were needed:1} Final Clinical Impression(s) / ED Diagnoses Final diagnoses:  None    Rx / DC Orders ED Discharge Orders     None

## 2022-12-04 ENCOUNTER — Other Ambulatory Visit: Payer: Self-pay

## 2022-12-04 ENCOUNTER — Other Ambulatory Visit (HOSPITAL_BASED_OUTPATIENT_CLINIC_OR_DEPARTMENT_OTHER): Payer: Self-pay

## 2022-12-22 ENCOUNTER — Other Ambulatory Visit (HOSPITAL_BASED_OUTPATIENT_CLINIC_OR_DEPARTMENT_OTHER): Payer: Self-pay

## 2023-02-02 ENCOUNTER — Other Ambulatory Visit: Payer: Self-pay | Admitting: Nurse Practitioner

## 2023-02-02 ENCOUNTER — Other Ambulatory Visit (HOSPITAL_BASED_OUTPATIENT_CLINIC_OR_DEPARTMENT_OTHER): Payer: Self-pay

## 2023-02-02 ENCOUNTER — Encounter: Payer: Self-pay | Admitting: Nurse Practitioner

## 2023-02-02 DIAGNOSIS — Z111 Encounter for screening for respiratory tuberculosis: Secondary | ICD-10-CM

## 2023-02-02 DIAGNOSIS — I10 Essential (primary) hypertension: Secondary | ICD-10-CM

## 2023-02-02 MED ORDER — METOPROLOL SUCCINATE ER 50 MG PO TB24
50.0000 mg | ORAL_TABLET | Freq: Every day | ORAL | 1 refills | Status: DC
  Filled 2023-02-02: qty 90, 90d supply, fill #0

## 2023-02-02 NOTE — Progress Notes (Signed)
Needs TB screen

## 2023-02-05 ENCOUNTER — Other Ambulatory Visit (INDEPENDENT_AMBULATORY_CARE_PROVIDER_SITE_OTHER): Payer: Commercial Managed Care - PPO

## 2023-02-05 DIAGNOSIS — Z111 Encounter for screening for respiratory tuberculosis: Secondary | ICD-10-CM

## 2023-02-05 NOTE — Addendum Note (Signed)
Addended by: Mervin Kung A on: 02/05/2023 01:28 PM   Modules accepted: Orders

## 2023-02-07 LAB — QUANTIFERON-TB GOLD PLUS
Mitogen-NIL: 2.73 IU/mL
NIL: 0.02 IU/mL
QuantiFERON-TB Gold Plus: NEGATIVE
TB1-NIL: <0 IU/mL
TB2-NIL: 0 IU/mL

## 2023-03-08 ENCOUNTER — Ambulatory Visit: Payer: Commercial Managed Care - PPO | Admitting: Nurse Practitioner

## 2023-03-09 ENCOUNTER — Encounter: Payer: Self-pay | Admitting: Family Medicine

## 2023-03-09 ENCOUNTER — Ambulatory Visit (INDEPENDENT_AMBULATORY_CARE_PROVIDER_SITE_OTHER): Payer: Commercial Managed Care - PPO | Admitting: Family Medicine

## 2023-03-09 ENCOUNTER — Other Ambulatory Visit (HOSPITAL_BASED_OUTPATIENT_CLINIC_OR_DEPARTMENT_OTHER): Payer: Self-pay

## 2023-03-09 VITALS — BP 114/82 | HR 67 | Temp 98.0°F | Ht 61.0 in | Wt 190.0 lb

## 2023-03-09 DIAGNOSIS — Z0000 Encounter for general adult medical examination without abnormal findings: Secondary | ICD-10-CM

## 2023-03-09 DIAGNOSIS — I10 Essential (primary) hypertension: Secondary | ICD-10-CM | POA: Diagnosis not present

## 2023-03-09 DIAGNOSIS — Z1159 Encounter for screening for other viral diseases: Secondary | ICD-10-CM

## 2023-03-09 DIAGNOSIS — Z114 Encounter for screening for human immunodeficiency virus [HIV]: Secondary | ICD-10-CM | POA: Diagnosis not present

## 2023-03-09 LAB — CBC
HCT: 38.3 % (ref 36.0–46.0)
Hemoglobin: 12.3 g/dL (ref 12.0–15.0)
MCHC: 32 g/dL (ref 30.0–36.0)
MCV: 85.7 fl (ref 78.0–100.0)
Platelets: 345 10*3/uL (ref 150.0–400.0)
RBC: 4.47 Mil/uL (ref 3.87–5.11)
RDW: 14.2 % (ref 11.5–15.5)
WBC: 7.5 10*3/uL (ref 4.0–10.5)

## 2023-03-09 LAB — COMPREHENSIVE METABOLIC PANEL
ALT: 12 U/L (ref 0–35)
AST: 18 U/L (ref 0–37)
Albumin: 4.4 g/dL (ref 3.5–5.2)
Alkaline Phosphatase: 59 U/L (ref 39–117)
BUN: 11 mg/dL (ref 6–23)
CO2: 29 mEq/L (ref 19–32)
Calcium: 9.7 mg/dL (ref 8.4–10.5)
Chloride: 101 mEq/L (ref 96–112)
Creatinine, Ser: 0.78 mg/dL (ref 0.40–1.20)
GFR: 104.04 mL/min (ref >60.00)
Glucose, Bld: 76 mg/dL (ref 70–99)
Potassium: 4.3 mEq/L (ref 3.5–5.1)
Sodium: 137 mEq/L (ref 135–145)
Total Bilirubin: 0.6 mg/dL (ref 0.2–1.2)
Total Protein: 7.4 g/dL (ref 6.0–8.3)

## 2023-03-09 LAB — LIPID PANEL
Cholesterol: 249 mg/dL — ABNORMAL HIGH (ref 0–200)
HDL: 64.1 mg/dL (ref >39.00)
LDL Cholesterol: 171 mg/dL — ABNORMAL HIGH (ref 0–99)
NonHDL: 184.57
Total CHOL/HDL Ratio: 4
Triglycerides: 68 mg/dL (ref 0.0–149.0)
VLDL: 13.6 mg/dL (ref 0.0–40.0)

## 2023-03-09 MED ORDER — AMLODIPINE BESYLATE 2.5 MG PO TABS
2.5000 mg | ORAL_TABLET | Freq: Every day | ORAL | 3 refills | Status: DC
  Filled 2023-03-09: qty 90, 90d supply, fill #0

## 2023-03-09 NOTE — Progress Notes (Signed)
Chief Complaint  Patient presents with   paperwork    School Needs a refill on Amlodipine     Well Woman Monique Turner is here for a complete physical.   Her last physical was >1 year ago.  Current diet: in general, a "healthy" diet. Current exercise: walking, lifting wts, calisthenics. Fatigue out of ordinary? No Seatbelt? Yes Advanced directive? No  Health Maintenance Pap/HPV- Yes Tetanus- Yes HIV screening- No Hep C screening- No  Past Medical History:  Diagnosis Date   Asthma    Essential hypertension 05/13/2020   Family history of premature CAD 07/21/2021   PGF and paternal uncle   Menorrhagia with regular cycle 05/13/2020     Past Surgical History:  Procedure Laterality Date   NO PAST SURGERIES      Medications  Current Outpatient Medications on File Prior to Visit  Medication Sig Dispense Refill   metoprolol succinate (TOPROL-XL) 50 MG 24 hr tablet TAKE 1 TABLET BY MOUTH DAILY WITH OR IMMEDIATELY FOLLOWING A MEAL 90 tablet 1   pravastatin (PRAVACHOL) 20 MG tablet Take 1 tablet (20 mg total) by mouth daily. 90 tablet 3    Allergies Allergies  Allergen Reactions   Sulfa Antibiotics Diarrhea    Review of Systems: Constitutional:  no unexpected weight changes Eye:  no recent significant change in vision Ear/Nose/Mouth/Throat:  Ears:  no tinnitus or vertigo and no recent change in hearing Nose/Mouth/Throat:  no complaints of nasal congestion, no sore throat Cardiovascular: no chest pain Respiratory:  no cough and no shortness of breath Gastrointestinal:  no abdominal pain, no change in bowel habits GU:  Female: negative for dysuria or pelvic pain Musculoskeletal/Extremities:  no pain of the joints Integumentary (Skin/Breast):  no abnormal skin lesions reported Neurologic:  no headaches Endocrine:  denies fatigue Hematologic/Lymphatic:  No areas of easy bleeding  Exam BP 114/82 (BP Location: Left Arm, Cuff Size: Normal)   Pulse 67   Temp 98 F (36.7  C) (Oral)   Ht 5\' 1"  (1.549 m)   Wt 190 lb (86.2 kg)   SpO2 99%   BMI 35.90 kg/m  General:  well developed, well nourished, in no apparent distress Skin:  no significant moles, warts, or growths Head:  no masses, lesions, or tenderness Eyes:  pupils equal and round, sclera anicteric without injection Ears:  canals without lesions, TMs shiny without retraction, no obvious effusion, no erythema Nose:  nares patent, mucosa normal, and no drainage  Throat/Pharynx:  lips and gingiva without lesion; tongue and uvula midline; non-inflamed pharynx; no exudates or postnasal drainage Neck: neck supple without adenopathy, thyromegaly, or masses Lungs:  clear to auscultation, breath sounds equal bilaterally, no respiratory distress Cardio:  regular rate and rhythm, no bruits, no LE edema Abdomen:  abdomen soft, nontender; bowel sounds normal; no masses or organomegaly Genital: Defer to GYN Musculoskeletal:  symmetrical muscle groups noted without atrophy or deformity Extremities:  no clubbing, cyanosis, or edema, no deformities, no skin discoloration Neuro:  gait normal; deep tendon reflexes normal and symmetric Psych: well oriented with normal range of affect and appropriate judgment/insight  Assessment and Plan  Well adult exam - Plan: CBC, Comprehensive metabolic panel, Lipid panel  Essential hypertension - Plan: amLODipine (NORVASC) 2.5 MG tablet  Screening for HIV without presence of risk factors - Plan: HIV Antibody (routine testing w rflx)  Encounter for hepatitis C screening test for low risk patient - Plan: Hepatitis C antibody   Well 27 y.o. female. Counseled on diet and  exercise. Advanced directive form provided today.  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  Other orders as above. Follow up w reg PCP prn.  The patient voiced understanding and agreement to the plan.  Jilda Roche [place], DO 03/09/23 9:27 AM

## 2023-03-09 NOTE — Patient Instructions (Signed)
Give us 2-3 business days to get the results of your labs back.   Keep the diet clean and stay active.  Please get me a copy of your advanced directive form at your convenience.   Let us know if you need anything.  

## 2023-03-10 LAB — HEPATITIS C ANTIBODY: Hepatitis C Ab: NONREACTIVE

## 2023-03-10 LAB — HIV ANTIBODY (ROUTINE TESTING W REFLEX): HIV 1&2 Ab, 4th Generation: NONREACTIVE

## 2023-05-08 ENCOUNTER — Other Ambulatory Visit: Payer: Self-pay

## 2023-05-08 ENCOUNTER — Other Ambulatory Visit (HOSPITAL_BASED_OUTPATIENT_CLINIC_OR_DEPARTMENT_OTHER): Payer: Self-pay

## 2023-05-08 DIAGNOSIS — I10 Essential (primary) hypertension: Secondary | ICD-10-CM

## 2023-05-08 MED ORDER — METOPROLOL SUCCINATE ER 50 MG PO TB24
50.0000 mg | ORAL_TABLET | Freq: Every day | ORAL | 1 refills | Status: DC
  Filled 2023-05-08: qty 90, 90d supply, fill #0
  Filled 2023-08-03: qty 90, 90d supply, fill #1

## 2023-06-12 ENCOUNTER — Other Ambulatory Visit: Payer: Self-pay

## 2023-06-12 ENCOUNTER — Other Ambulatory Visit (HOSPITAL_BASED_OUTPATIENT_CLINIC_OR_DEPARTMENT_OTHER): Payer: Self-pay

## 2023-06-12 DIAGNOSIS — I10 Essential (primary) hypertension: Secondary | ICD-10-CM

## 2023-06-12 MED ORDER — AMLODIPINE BESYLATE 2.5 MG PO TABS
2.5000 mg | ORAL_TABLET | Freq: Every day | ORAL | 3 refills | Status: DC
  Filled 2023-06-12: qty 90, 90d supply, fill #0

## 2023-06-22 ENCOUNTER — Other Ambulatory Visit: Payer: Self-pay

## 2023-06-22 DIAGNOSIS — Z23 Encounter for immunization: Secondary | ICD-10-CM

## 2023-09-04 ENCOUNTER — Other Ambulatory Visit: Payer: Self-pay

## 2023-09-04 ENCOUNTER — Other Ambulatory Visit (HOSPITAL_BASED_OUTPATIENT_CLINIC_OR_DEPARTMENT_OTHER): Payer: Self-pay

## 2023-09-04 DIAGNOSIS — I10 Essential (primary) hypertension: Secondary | ICD-10-CM

## 2023-09-04 MED ORDER — AMLODIPINE BESYLATE 2.5 MG PO TABS
2.5000 mg | ORAL_TABLET | Freq: Every day | ORAL | 3 refills | Status: DC
  Filled 2023-09-04: qty 90, 90d supply, fill #0

## 2023-09-13 DIAGNOSIS — Z3200 Encounter for pregnancy test, result unknown: Secondary | ICD-10-CM | POA: Diagnosis not present

## 2023-10-19 ENCOUNTER — Other Ambulatory Visit: Payer: Self-pay

## 2023-10-19 DIAGNOSIS — N912 Amenorrhea, unspecified: Secondary | ICD-10-CM

## 2023-10-19 DIAGNOSIS — N920 Excessive and frequent menstruation with regular cycle: Secondary | ICD-10-CM

## 2023-10-26 ENCOUNTER — Ambulatory Visit (HOSPITAL_COMMUNITY): Payer: Commercial Managed Care - PPO

## 2023-10-29 DIAGNOSIS — N926 Irregular menstruation, unspecified: Secondary | ICD-10-CM | POA: Diagnosis not present

## 2023-10-29 DIAGNOSIS — Z3202 Encounter for pregnancy test, result negative: Secondary | ICD-10-CM | POA: Diagnosis not present

## 2023-10-29 DIAGNOSIS — Z118 Encounter for screening for other infectious and parasitic diseases: Secondary | ICD-10-CM | POA: Diagnosis not present

## 2023-10-29 DIAGNOSIS — R109 Unspecified abdominal pain: Secondary | ICD-10-CM | POA: Diagnosis not present

## 2023-10-30 ENCOUNTER — Other Ambulatory Visit: Payer: Self-pay | Admitting: Family Medicine

## 2023-10-30 DIAGNOSIS — N912 Amenorrhea, unspecified: Secondary | ICD-10-CM

## 2023-10-30 DIAGNOSIS — N920 Excessive and frequent menstruation with regular cycle: Secondary | ICD-10-CM

## 2023-11-02 ENCOUNTER — Encounter: Payer: Self-pay | Admitting: Family Medicine

## 2023-11-02 ENCOUNTER — Other Ambulatory Visit: Payer: Self-pay | Admitting: Nurse Practitioner

## 2023-11-02 ENCOUNTER — Other Ambulatory Visit (HOSPITAL_BASED_OUTPATIENT_CLINIC_OR_DEPARTMENT_OTHER): Payer: Self-pay

## 2023-11-02 ENCOUNTER — Encounter (HOSPITAL_BASED_OUTPATIENT_CLINIC_OR_DEPARTMENT_OTHER): Payer: Self-pay

## 2023-11-02 ENCOUNTER — Ambulatory Visit (INDEPENDENT_AMBULATORY_CARE_PROVIDER_SITE_OTHER): Payer: Commercial Managed Care - PPO | Admitting: Family Medicine

## 2023-11-02 VITALS — BP 126/80 | HR 88 | Resp 18 | Ht 61.0 in | Wt 203.0 lb

## 2023-11-02 DIAGNOSIS — I10 Essential (primary) hypertension: Secondary | ICD-10-CM

## 2023-11-02 DIAGNOSIS — N939 Abnormal uterine and vaginal bleeding, unspecified: Secondary | ICD-10-CM

## 2023-11-02 LAB — CBC
HCT: 37.6 % (ref 36.0–46.0)
Hemoglobin: 12.2 g/dL (ref 12.0–15.0)
MCHC: 32.6 g/dL (ref 30.0–36.0)
MCV: 85.3 fL (ref 78.0–100.0)
Platelets: 352 10*3/uL (ref 150.0–400.0)
RBC: 4.4 Mil/uL (ref 3.87–5.11)
RDW: 14.1 % (ref 11.5–15.5)
WBC: 6.5 10*3/uL (ref 4.0–10.5)

## 2023-11-02 LAB — LIPID PANEL
Cholesterol: 260 mg/dL — ABNORMAL HIGH (ref 0–200)
HDL: 62.2 mg/dL (ref >39.00)
LDL Cholesterol: 181 mg/dL — ABNORMAL HIGH (ref 0–99)
NonHDL: 197.69
Total CHOL/HDL Ratio: 4
Triglycerides: 85 mg/dL (ref 0.0–149.0)
VLDL: 17 mg/dL (ref 0.0–40.0)

## 2023-11-02 LAB — TSH: TSH: 0.56 u[IU]/mL (ref 0.35–5.50)

## 2023-11-02 MED ORDER — METOPROLOL SUCCINATE ER 100 MG PO TB24
100.0000 mg | ORAL_TABLET | Freq: Every day | ORAL | 3 refills | Status: DC
  Filled 2023-11-02: qty 90, 90d supply, fill #0
  Filled 2024-01-24: qty 90, 90d supply, fill #1
  Filled 2024-04-23: qty 90, 90d supply, fill #2
  Filled 2024-07-22: qty 90, 90d supply, fill #3

## 2023-11-02 NOTE — Progress Notes (Signed)
Chief Complaint  Patient presents with   Follow-up    Refill BP medication     Subjective Monique Turner is a 28 y.o. female who presents for hypertension follow up. She does not monitor home blood pressures. She is compliant with medications- Norvasc 2.5 mg, Toprol XL 50 mg/d. Patient has these side effects of medication: none She is usually adhering to a healthy diet overall. Current exercise: none No CP or SOB.    Past Medical History:  Diagnosis Date   Asthma    Essential hypertension 05/13/2020   Family history of premature CAD 07/21/2021   PGF and paternal uncle   Menorrhagia with regular cycle 05/13/2020    Exam BP 126/80 (BP Location: Left Arm, Patient Position: Sitting, Cuff Size: Normal)   Pulse 88   Resp 18   Ht 5\' 1"  (1.549 m)   Wt 203 lb (92.1 kg)   SpO2 98%   BMI 38.36 kg/m  General:  well developed, well nourished, in no apparent distress Heart: RRR, no bruits, no LE edema Lungs: clear to auscultation, no accessory muscle use Psych: well oriented with normal range of affect and appropriate judgment/insight  Essential hypertension - Plan: metoprolol succinate (TOPROL-XL) 100 MG 24 hr tablet, Lipid panel  Abnormal uterine bleeding (AUB) - Plan: TSH, CBC  Chronic, stable.  Lets drop the Norvasc and increase the metoprolol from 50 mg daily to 100 mg daily.  This will decrease her pill burden.  Follow-up on elevated cholesterol.  Counseled on diet and exercise. Check CBC and TSH.  She has an appointment with her GYN in a couple weeks for an ultrasound.  There is been a question of possible pregnancy so we will hold off on any hormonal options until that imaging is back. F/u as originally scheduled with regular PCP. The patient voiced understanding and agreement to the plan.  Jilda Roche [place], DO 11/02/23  9:57 AM

## 2023-11-02 NOTE — Patient Instructions (Signed)
Give [place] 2-3 business days to get the results of your labs back.   Keep the diet clean and stay active.  Monitor blood pressure for the next couple weeks and let me know if it goes up while off of the Norvasc.   Let us know if you need anything.

## 2023-11-21 ENCOUNTER — Ambulatory Visit: Admission: RE | Admit: 2023-11-21 | Payer: Commercial Managed Care - PPO | Source: Ambulatory Visit

## 2023-11-21 DIAGNOSIS — N921 Excessive and frequent menstruation with irregular cycle: Secondary | ICD-10-CM | POA: Diagnosis not present

## 2024-01-11 ENCOUNTER — Other Ambulatory Visit (HOSPITAL_BASED_OUTPATIENT_CLINIC_OR_DEPARTMENT_OTHER): Payer: Self-pay

## 2024-01-11 DIAGNOSIS — Z01419 Encounter for gynecological examination (general) (routine) without abnormal findings: Secondary | ICD-10-CM | POA: Diagnosis not present

## 2024-01-11 DIAGNOSIS — R87610 Atypical squamous cells of undetermined significance on cytologic smear of cervix (ASC-US): Secondary | ICD-10-CM | POA: Diagnosis not present

## 2024-01-11 DIAGNOSIS — Z1331 Encounter for screening for depression: Secondary | ICD-10-CM | POA: Diagnosis not present

## 2024-01-11 MED ORDER — MEDROXYPROGESTERONE ACETATE 10 MG PO TABS
10.0000 mg | ORAL_TABLET | Freq: Every day | ORAL | 3 refills | Status: AC
  Filled 2024-01-11 – 2024-01-30 (×3): qty 21, 21d supply, fill #0

## 2024-01-16 ENCOUNTER — Other Ambulatory Visit

## 2024-01-16 ENCOUNTER — Other Ambulatory Visit (HOSPITAL_COMMUNITY)
Admission: RE | Admit: 2024-01-16 | Discharge: 2024-01-16 | Disposition: A | Discharge status: Home / Self Care | Source: Ambulatory Visit | Attending: Nurse Practitioner | Admitting: Nurse Practitioner

## 2024-01-16 ENCOUNTER — Other Ambulatory Visit: Payer: Self-pay

## 2024-01-16 ENCOUNTER — Other Ambulatory Visit (HOSPITAL_BASED_OUTPATIENT_CLINIC_OR_DEPARTMENT_OTHER): Payer: Self-pay

## 2024-01-16 DIAGNOSIS — N898 Other specified noninflammatory disorders of vagina: Secondary | ICD-10-CM

## 2024-01-16 MED ORDER — METRONIDAZOLE 500 MG PO TABS
500.0000 mg | ORAL_TABLET | Freq: Two times a day (BID) | ORAL | 0 refills | Status: AC
  Filled 2024-01-16: qty 14, 7d supply, fill #0

## 2024-01-16 NOTE — Addendum Note (Signed)
 Addended by: Mervin Kung A on: 01/16/2024 01:43 PM   Modules accepted: Orders

## 2024-01-17 ENCOUNTER — Other Ambulatory Visit (HOSPITAL_BASED_OUTPATIENT_CLINIC_OR_DEPARTMENT_OTHER): Payer: Self-pay

## 2024-01-17 LAB — CERVICOVAGINAL ANCILLARY ONLY
Bacterial Vaginitis (gardnerella): POSITIVE — AB
Candida Glabrata: NEGATIVE
Candida Vaginitis: NEGATIVE
Chlamydia: NEGATIVE
Comment: NEGATIVE
Comment: NEGATIVE
Comment: NEGATIVE
Comment: NEGATIVE
Comment: NEGATIVE
Comment: NORMAL
Neisseria Gonorrhea: NEGATIVE
Trichomonas: POSITIVE — AB

## 2024-01-18 ENCOUNTER — Encounter: Payer: Self-pay | Admitting: Family Medicine

## 2024-01-24 ENCOUNTER — Other Ambulatory Visit

## 2024-01-24 ENCOUNTER — Other Ambulatory Visit: Payer: Self-pay

## 2024-01-24 DIAGNOSIS — Z111 Encounter for screening for respiratory tuberculosis: Secondary | ICD-10-CM

## 2024-01-26 ENCOUNTER — Encounter: Payer: Self-pay | Admitting: Family Medicine

## 2024-01-26 LAB — QUANTIFERON-TB GOLD PLUS
Mitogen-NIL: >10 [IU]/mL
NIL: 0.04 [IU]/mL
QuantiFERON-TB Gold Plus: NEGATIVE
TB1-NIL: 0 [IU]/mL
TB2-NIL: 0 [IU]/mL

## 2024-01-29 ENCOUNTER — Other Ambulatory Visit (HOSPITAL_BASED_OUTPATIENT_CLINIC_OR_DEPARTMENT_OTHER): Payer: Self-pay

## 2024-01-30 ENCOUNTER — Other Ambulatory Visit (HOSPITAL_BASED_OUTPATIENT_CLINIC_OR_DEPARTMENT_OTHER): Payer: Self-pay

## 2024-02-11 ENCOUNTER — Other Ambulatory Visit (HOSPITAL_BASED_OUTPATIENT_CLINIC_OR_DEPARTMENT_OTHER): Payer: Self-pay

## 2024-03-13 ENCOUNTER — Other Ambulatory Visit: Payer: Self-pay

## 2024-03-13 ENCOUNTER — Encounter (HOSPITAL_BASED_OUTPATIENT_CLINIC_OR_DEPARTMENT_OTHER): Payer: Self-pay | Admitting: Emergency Medicine

## 2024-03-13 ENCOUNTER — Emergency Department (HOSPITAL_BASED_OUTPATIENT_CLINIC_OR_DEPARTMENT_OTHER)
Admission: EM | Admit: 2024-03-13 | Discharge: 2024-03-13 | Disposition: A | Discharge status: Home / Self Care | Attending: Emergency Medicine | Admitting: Emergency Medicine

## 2024-03-13 DIAGNOSIS — S39012A Strain of muscle, fascia and tendon of lower back, initial encounter: Secondary | ICD-10-CM | POA: Insufficient documentation

## 2024-03-13 DIAGNOSIS — Y92410 Unspecified street and highway as the place of occurrence of the external cause: Secondary | ICD-10-CM | POA: Diagnosis not present

## 2024-03-13 DIAGNOSIS — S3992XA Unspecified injury of lower back, initial encounter: Secondary | ICD-10-CM | POA: Diagnosis present

## 2024-03-13 LAB — HCG, QUANTITATIVE, PREGNANCY: hCG, Beta Chain, Quant, S: <1 m[IU]/mL (ref <5)

## 2024-03-13 LAB — PREGNANCY, URINE: Preg Test, Ur: NEGATIVE
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
## 2024-03-13 NOTE — Discharge Instructions (Addendum)
 As we discussed, you can use a heating pad on your lower back along with your previously prescribed ibuprofen  and Tylenol  as needed for pain.  Please follow-up with your primary care should this fail to adequately manage her discomfort as they may consider prescribing a muscle relaxer at that time.

## 2024-03-13 NOTE — ED Notes (Signed)
 Reviewed discharge instructions , pain management and follow up with pt. Work note provided. Pt states understanding. Ambulatory at discharge without difficulty

## 2024-03-14 LAB — ABO/RH: ABO/RH(D): O POS

## 2024-04-28 ENCOUNTER — Inpatient Hospital Stay (HOSPITAL_COMMUNITY)
Admission: AD | Admit: 2024-04-28 | Discharge: 2024-04-28 | Disposition: A | Discharge status: Home / Self Care | Attending: Obstetrics and Gynecology | Admitting: Obstetrics and Gynecology

## 2024-04-28 ENCOUNTER — Other Ambulatory Visit (HOSPITAL_BASED_OUTPATIENT_CLINIC_OR_DEPARTMENT_OTHER): Payer: Self-pay

## 2024-04-28 DIAGNOSIS — R102 Pelvic and perineal pain: Secondary | ICD-10-CM | POA: Diagnosis not present

## 2024-04-28 DIAGNOSIS — Z3202 Encounter for pregnancy test, result negative: Secondary | ICD-10-CM | POA: Insufficient documentation

## 2024-04-28 LAB — POCT PREGNANCY, URINE: Preg Test, Ur: NEGATIVE

## 2024-04-28 NOTE — MAU Note (Signed)
 Pt stated she did feel better seeing the US .  Explained to her that the bleeding she had in June was more than likely a period.

## 2024-04-28 NOTE — MAU Provider Note (Signed)
 History     [id number]  Arrival date and time: 04/28/24 1805    Chief Complaint  Patient presents with   Possible Pregnancy     HPI Monique Turner is a 28 y.o. with PMHx notable for HTN, who presents for concern for pregnancy.   Patient reports she is convinced she is pregnant She is having nausea, pelvic pressure, and reports she feels kicks She has never been pregnant before She does not think her symptoms are explained by any other possibilities Last had vaginal bleeding last month Has a PCP but has not seen them regarding these issues   --/--/O POS (06/05 1218)  OB History   No obstetric history on file.     Past Medical History:  Diagnosis Date   Asthma    Essential hypertension 05/13/2020   Family history of premature CAD 07/21/2021   PGF and paternal uncle   Menorrhagia with regular cycle 05/13/2020    Past Surgical History:  Procedure Laterality Date   NO PAST SURGERIES      Family History  Problem Relation Age of Onset   Hyperlipidemia Father    Hypertension Father    Diabetes Maternal Aunt    Diabetes Maternal Uncle    Hypertension Paternal Aunt    Thyroid  disease Paternal Aunt    Heart disease Paternal Uncle    Hypertension Paternal Uncle    Early death Paternal Uncle    Heart disease Paternal Uncle 70       CAD/MI   Cancer Paternal Grandmother 24       unknown   Early death Paternal Grandfather 36       CAD/MI    Social History   Socioeconomic History   Marital status: Single    Spouse name: Not on file   Number of children: Not on file   Years of education: Not on file   Highest education level: Not on file  Occupational History   Not on file  Tobacco Use   Smoking status: Never   Smokeless tobacco: Never  Substance and Sexual Activity   Alcohol use: Not Currently   Drug use: Never   Sexual activity: Yes    Birth control/protection: Condom  Other Topics Concern   Not on file  Social History Narrative   Not on file    Social Drivers of Health   Financial Resource Strain: Not on file  Food Insecurity: Not on file  Transportation Needs: Not on file  Physical Activity: Not on file  Stress: Not on file  Social Connections: Unknown (02/21/2022)   Received from [facility]   Social Network    Social Network: Not on file  Intimate Partner Violence: Unknown (01/13/2022)   Received from [facility]   HITS    Physically Hurt: Not on file    Insult or Talk Down To: Not on file    Threaten Physical Harm: Not on file    Scream or Curse: Not on file    Allergies  Allergen Reactions   Sulfa Antibiotics Diarrhea    No current facility-administered medications on file prior to encounter.   Current Outpatient Medications on File Prior to Encounter  Medication Sig Dispense Refill   metoprolol  succinate (TOPROL -XL) 100 MG 24 hr tablet Take 1 tablet (100 mg total) by mouth daily. Take with or immediately following a meal. 90 tablet 3     ROS Pertinent positives and negative per HPI, all others reviewed and negative  Physical Exam   BP ROLLEN)  157/110 (BP Location: Right Arm)   Pulse (!) 106   Temp 98.5 F (36.9 C) (Oral)   Resp 18   Ht 5' 1 (1.549 m)   Wt 104.6 kg   LMP 03/22/2024   SpO2 100%   BMI 43.59 kg/m   Patient Vitals for the past 24 hrs:  BP Temp Temp src Pulse Resp SpO2 Height Weight  04/28/24 1825 (!) 157/110 98.5 F (36.9 C) Oral (!) 106 18 100 % 5' 1 (1.549 m) 104.6 kg    Physical Exam Vitals reviewed.  Constitutional:      General: She is not in acute distress.    Appearance: She is well-developed. She is not diaphoretic.  Eyes:     General: No scleral icterus. Pulmonary:     Effort: Pulmonary effort is normal. No respiratory distress.  Abdominal:     General: There is no distension.     Palpations: Abdomen is soft.     Tenderness: There is no abdominal tenderness. There is no guarding or rebound.  Skin:    General: Skin is warm and dry.  Neurological:     Mental  Status: She is alert.     Coordination: Coordination normal.      Cervical Exam    Bedside Ultrasound Pt informed that the ultrasound is considered a limited OB ultrasound and is not intended to be a complete ultrasound exam.  Patient also informed that the ultrasound is not being completed with the intent of assessing for fetal or placental anomalies or any pelvic abnormalities.  Explained that the purpose of today's ultrasound is to assess for  presence of pregnancy.  Patient acknowledges the purpose of the exam and the limitations of the study.      My interpretation: normal non pregnant uterus visualized, thin endometrial strip   Labs Results for orders placed or performed during the hospital encounter of 04/28/24 (from the past 24 hours)  Pregnancy, urine POC     Status: None   Collection Time: 04/28/24  6:35 PM  Result Value Ref Range   Preg Test, Ur NEGATIVE NEGATIVE    Imaging No results found.  MAU Course  Procedures Lab Orders         Pregnancy, urine POC    No orders of the defined types were placed in this encounter.  Imaging Orders  No imaging studies ordered today    MDM Minimal (Level 1)  Assessment and Plan  #Pelvic pressure Patient shown bedside US  and discussed negative pregnancy test. Emphasized that all evidence points to her not being pregnant. Emphasized that there are many other possibilities for her symptoms that do merit a full workup to further evaluate, and strongly recommended she either seek care in ED or go to see PCP for further workup.   Dispo: discharged to home in stable condition    Donnice CHRISTELLA Carolus, MD/MPH 04/28/24 6:55 PM  Allergies as of 04/28/2024       Reactions   Sulfa Antibiotics Diarrhea        Medication List     TAKE these medications    metoprolol  succinate 100 MG 24 hr tablet Commonly known as: TOPROL -XL Take 1 tablet (100 mg total) by mouth daily. Take with or immediately following a meal.
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
## 2024-04-28 NOTE — Discharge Instructions (Signed)
 You were evaluated in the MAU due to concern for possible pregnancy. We did a urine pregnant test that was negative. I also did a bedside ultrasound that showed a normal, non-pregnant uterus. We are confident that you are not pregnant. We recommend you go to the emergency department or see your primary care doctor to further evaluate your symptoms.

## 2024-05-21 ENCOUNTER — Encounter: Payer: Self-pay | Admitting: Family Medicine

## 2024-05-21 ENCOUNTER — Ambulatory Visit: Payer: Self-pay | Admitting: Family Medicine

## 2024-05-21 ENCOUNTER — Ambulatory Visit (INDEPENDENT_AMBULATORY_CARE_PROVIDER_SITE_OTHER): Admitting: Family Medicine

## 2024-05-21 VITALS — BP 118/78 | HR 54 | Temp 98.2°F | Resp 18 | Ht 61.0 in | Wt 230.7 lb

## 2024-05-21 DIAGNOSIS — Z0000 Encounter for general adult medical examination without abnormal findings: Secondary | ICD-10-CM | POA: Diagnosis not present

## 2024-05-21 DIAGNOSIS — D649 Anemia, unspecified: Secondary | ICD-10-CM

## 2024-05-21 LAB — CBC
HCT: 36.1 % (ref 36.0–46.0)
Hemoglobin: 11.9 g/dL — ABNORMAL LOW (ref 12.0–15.0)
MCHC: 33 g/dL (ref 30.0–36.0)
MCV: 82.8 fl (ref 78.0–100.0)
Platelets: 360 K/uL (ref 150.0–400.0)
RBC: 4.36 Mil/uL (ref 3.87–5.11)
RDW: 14 % (ref 11.5–15.5)
WBC: 6.4 K/uL (ref 4.0–10.5)

## 2024-05-21 LAB — COMPREHENSIVE METABOLIC PANEL WITH GFR
ALT: 14 U/L (ref 0–35)
AST: 17 U/L (ref 0–37)
Albumin: 4.1 g/dL (ref 3.5–5.2)
Alkaline Phosphatase: 66 U/L (ref 39–117)
BUN: 8 mg/dL (ref 6–23)
CO2: 28 meq/L (ref 19–32)
Calcium: 9.1 mg/dL (ref 8.4–10.5)
Chloride: 104 meq/L (ref 96–112)
Creatinine, Ser: 0.76 mg/dL (ref 0.40–1.20)
GFR: 106.44 mL/min (ref >60.00)
Glucose, Bld: 85 mg/dL (ref 70–99)
Potassium: 4.1 meq/L (ref 3.5–5.1)
Sodium: 140 meq/L (ref 135–145)
Total Bilirubin: 0.4 mg/dL (ref 0.2–1.2)
Total Protein: 6.9 g/dL (ref 6.0–8.3)

## 2024-05-21 LAB — LIPID PANEL
Cholesterol: 249 mg/dL — ABNORMAL HIGH (ref 0–200)
HDL: 59.9 mg/dL (ref >39.00)
LDL Cholesterol: 179 mg/dL — ABNORMAL HIGH (ref 0–99)
NonHDL: 189.41
Total CHOL/HDL Ratio: 4
Triglycerides: 54 mg/dL (ref 0.0–149.0)
VLDL: 10.8 mg/dL (ref 0.0–40.0)

## 2024-05-21 NOTE — Progress Notes (Signed)
 Chief Complaint  Patient presents with   Annual Exam     Well Woman Monique Turner is here for a complete physical.   Her last physical was >1 year ago.  Current diet: in general, a healthy diet. Current exercise: none. Contraception? No Patient's last menstrual period was 03/22/2024.  Fatigue out of ordinary? No Seatbelt? Yes Advanced directive? No  Health Maintenance Pap/HPV- Yes Tetanus- Yes HIV screening- Yes Hep C screening- Yes  Past Medical History:  Diagnosis Date   Essential hypertension 05/13/2020   Family history of premature CAD 07/21/2021   PGF and paternal uncle   Menorrhagia with regular cycle 05/13/2020     Past Surgical History:  Procedure Laterality Date   NO PAST SURGERIES      Medications  Current Outpatient Medications on File Prior to Visit  Medication Sig Dispense Refill   metoprolol  succinate (TOPROL -XL) 100 MG 24 hr tablet Take 1 tablet (100 mg total) by mouth daily. Take with or immediately following a meal. 90 tablet 3    Allergies Allergies  Allergen Reactions   Sulfa Antibiotics Diarrhea    Review of Systems: Constitutional:  no unexpected weight changes Eye:  no recent significant change in vision Ear/Nose/Mouth/Throat:  Ears:  no tinnitus or vertigo and no recent change in hearing Nose/Mouth/Throat:  no complaints of nasal congestion, no sore throat Cardiovascular: no chest pain Respiratory:  no cough and no shortness of breath Gastrointestinal:  no abdominal pain, no change in bowel habits GU:  Female: negative for dysuria or pelvic pain Musculoskeletal/Extremities:  no pain of the joints Integumentary (Skin/Breast):  no abnormal skin lesions reported Neurologic:  no headaches Endocrine:  denies fatigue Hematologic/Lymphatic:  No areas of easy bleeding  Exam BP 118/78   Pulse (!) 54   Temp 98.2 F (36.8 C)   Resp 18   Ht 5' 1 (1.549 m)   Wt 230 lb 11.2 oz (104.6 kg)   LMP 03/22/2024   SpO2 99%   BMI 43.59  kg/m  General:  well developed, well nourished, in no apparent distress Skin:  no significant moles, warts, or growths Head:  no masses, lesions, or tenderness Eyes:  pupils equal and round, sclera anicteric without injection Ears:  canals without lesions, TMs shiny without retraction, no obvious effusion, no erythema Nose:  nares patent, mucosa normal, and no drainage  Throat/Pharynx:  lips and gingiva without lesion; tongue and uvula midline; non-inflamed pharynx; no exudates or postnasal drainage Neck: neck supple without adenopathy, thyromegaly, or masses Lungs:  clear to auscultation, breath sounds equal bilaterally, no respiratory distress Cardio:  regular rate and rhythm, no bruits, no LE edema Abdomen:  abdomen soft, nontender; bowel sounds normal; no masses or organomegaly Genital: Defer to GYN Musculoskeletal:  symmetrical muscle groups noted without atrophy or deformity Extremities:  no clubbing, cyanosis, or edema, no deformities, no skin discoloration Neuro:  gait normal; deep tendon reflexes normal and symmetric Psych: well oriented with normal range of affect and appropriate judgment/insight  Assessment and Plan  Well adult exam - Plan: CBC, Comprehensive metabolic panel with GFR, Lipid panel   Well 28 y.o. female. Counseled on diet and exercise. Advanced directive form offered today.  Other orders as above. Follow up as originally scheduled w reg PCP. Monique Turner The patient voiced understanding and agreement to the plan.  Mabel Mt Pemberwick, DO 05/21/24 9:55 AM

## 2024-05-21 NOTE — Patient Instructions (Signed)
 Give us  2-3 business days to get the results of your labs back.   Keep the diet clean and stay active.  Please get me a copy of your advanced directive form at your convenience.   I recommend getting the flu shot in mid October. This suggestion would change if the CDC comes out with a different recommendation.   Let us  know if you need anything.

## 2024-05-22 ENCOUNTER — Ambulatory Visit: Payer: Self-pay | Admitting: Family Medicine

## 2024-05-22 ENCOUNTER — Ambulatory Visit (INDEPENDENT_AMBULATORY_CARE_PROVIDER_SITE_OTHER)

## 2024-05-22 DIAGNOSIS — D649 Anemia, unspecified: Secondary | ICD-10-CM

## 2024-05-22 LAB — IBC + FERRITIN
Ferritin: 45.2 ng/mL (ref 10.0–291.0)
Iron: 49 ug/dL (ref 42–145)
Saturation Ratios: 14.8 % — ABNORMAL LOW (ref 20.0–50.0)
TIBC: 331.8 ug/dL (ref 250.0–450.0)
Transferrin: 237 mg/dL (ref 212.0–360.0)

## 2024-08-18 ENCOUNTER — Telehealth: Payer: Self-pay

## 2024-08-18 NOTE — Telephone Encounter (Signed)
 Lab letter for TB screening

## 2024-10-20 ENCOUNTER — Other Ambulatory Visit: Payer: Self-pay | Admitting: Family Medicine

## 2024-10-20 ENCOUNTER — Other Ambulatory Visit (HOSPITAL_BASED_OUTPATIENT_CLINIC_OR_DEPARTMENT_OTHER): Payer: Self-pay

## 2024-10-20 DIAGNOSIS — I10 Essential (primary) hypertension: Secondary | ICD-10-CM

## 2024-10-20 MED ORDER — METOPROLOL SUCCINATE ER 100 MG PO TB24
100.0000 mg | ORAL_TABLET | Freq: Every day | ORAL | 3 refills | Status: AC
  Filled 2024-10-20: qty 90, 90d supply, fill #0
